# Patient Record
Sex: Female | Born: 1975 | Race: Asian | Hispanic: No | Marital: Married | Smoking: Never smoker
Health system: Southern US, Community
[De-identification: ages and names within clinical notes are randomized; demographics above are authoritative.]

## PROBLEM LIST (undated history)

## (undated) ENCOUNTER — Inpatient Hospital Stay (HOSPITAL_COMMUNITY): Payer: Self-pay

## (undated) DIAGNOSIS — R6252 Short stature (child): Secondary | ICD-10-CM

## (undated) DIAGNOSIS — IMO0002 Reserved for concepts with insufficient information to code with codable children: Secondary | ICD-10-CM

## (undated) DIAGNOSIS — O09529 Supervision of elderly multigravida, unspecified trimester: Secondary | ICD-10-CM

## (undated) DIAGNOSIS — M25531 Pain in right wrist: Secondary | ICD-10-CM

## (undated) DIAGNOSIS — D649 Anemia, unspecified: Secondary | ICD-10-CM

## (undated) DIAGNOSIS — R2230 Localized swelling, mass and lump, unspecified upper limb: Secondary | ICD-10-CM

## (undated) DIAGNOSIS — R300 Dysuria: Secondary | ICD-10-CM

## (undated) HISTORY — DX: Pain in right wrist: M25.531

## (undated) HISTORY — DX: Reserved for concepts with insufficient information to code with codable children: IMO0002

## (undated) HISTORY — DX: Short stature (child): R62.52

## (undated) HISTORY — DX: Supervision of elderly multigravida, unspecified trimester: O09.529

## (undated) HISTORY — DX: Localized swelling, mass and lump, unspecified upper limb: R22.30

## (undated) HISTORY — DX: Anemia, unspecified: D64.9

## (undated) HISTORY — DX: Dysuria: R30.0

---

## 1998-03-27 HISTORY — PX: THERAPEUTIC ABORTION: SHX798

## 1998-03-27 HISTORY — PX: WISDOM TOOTH EXTRACTION: SHX21

## 2005-03-30 ENCOUNTER — Other Ambulatory Visit: Admission: RE | Admit: 2005-03-30 | Discharge: 2005-03-30 | Payer: Self-pay | Admitting: Obstetrics and Gynecology

## 2005-07-16 ENCOUNTER — Emergency Department (HOSPITAL_COMMUNITY): Admission: EM | Admit: 2005-07-16 | Discharge: 2005-07-16 | Payer: Self-pay | Admitting: Emergency Medicine

## 2006-06-13 ENCOUNTER — Other Ambulatory Visit: Admission: RE | Admit: 2006-06-13 | Discharge: 2006-06-13 | Payer: Self-pay | Admitting: Obstetrics & Gynecology

## 2006-09-28 IMAGING — CR DG CHEST 2V
2 series · 2 of 2 positions shown · non-contrast
Comparison: none

CLINICAL DATA: Left-sided chest pain.
 CHEST - 2 VIEW:

[w chest pa]
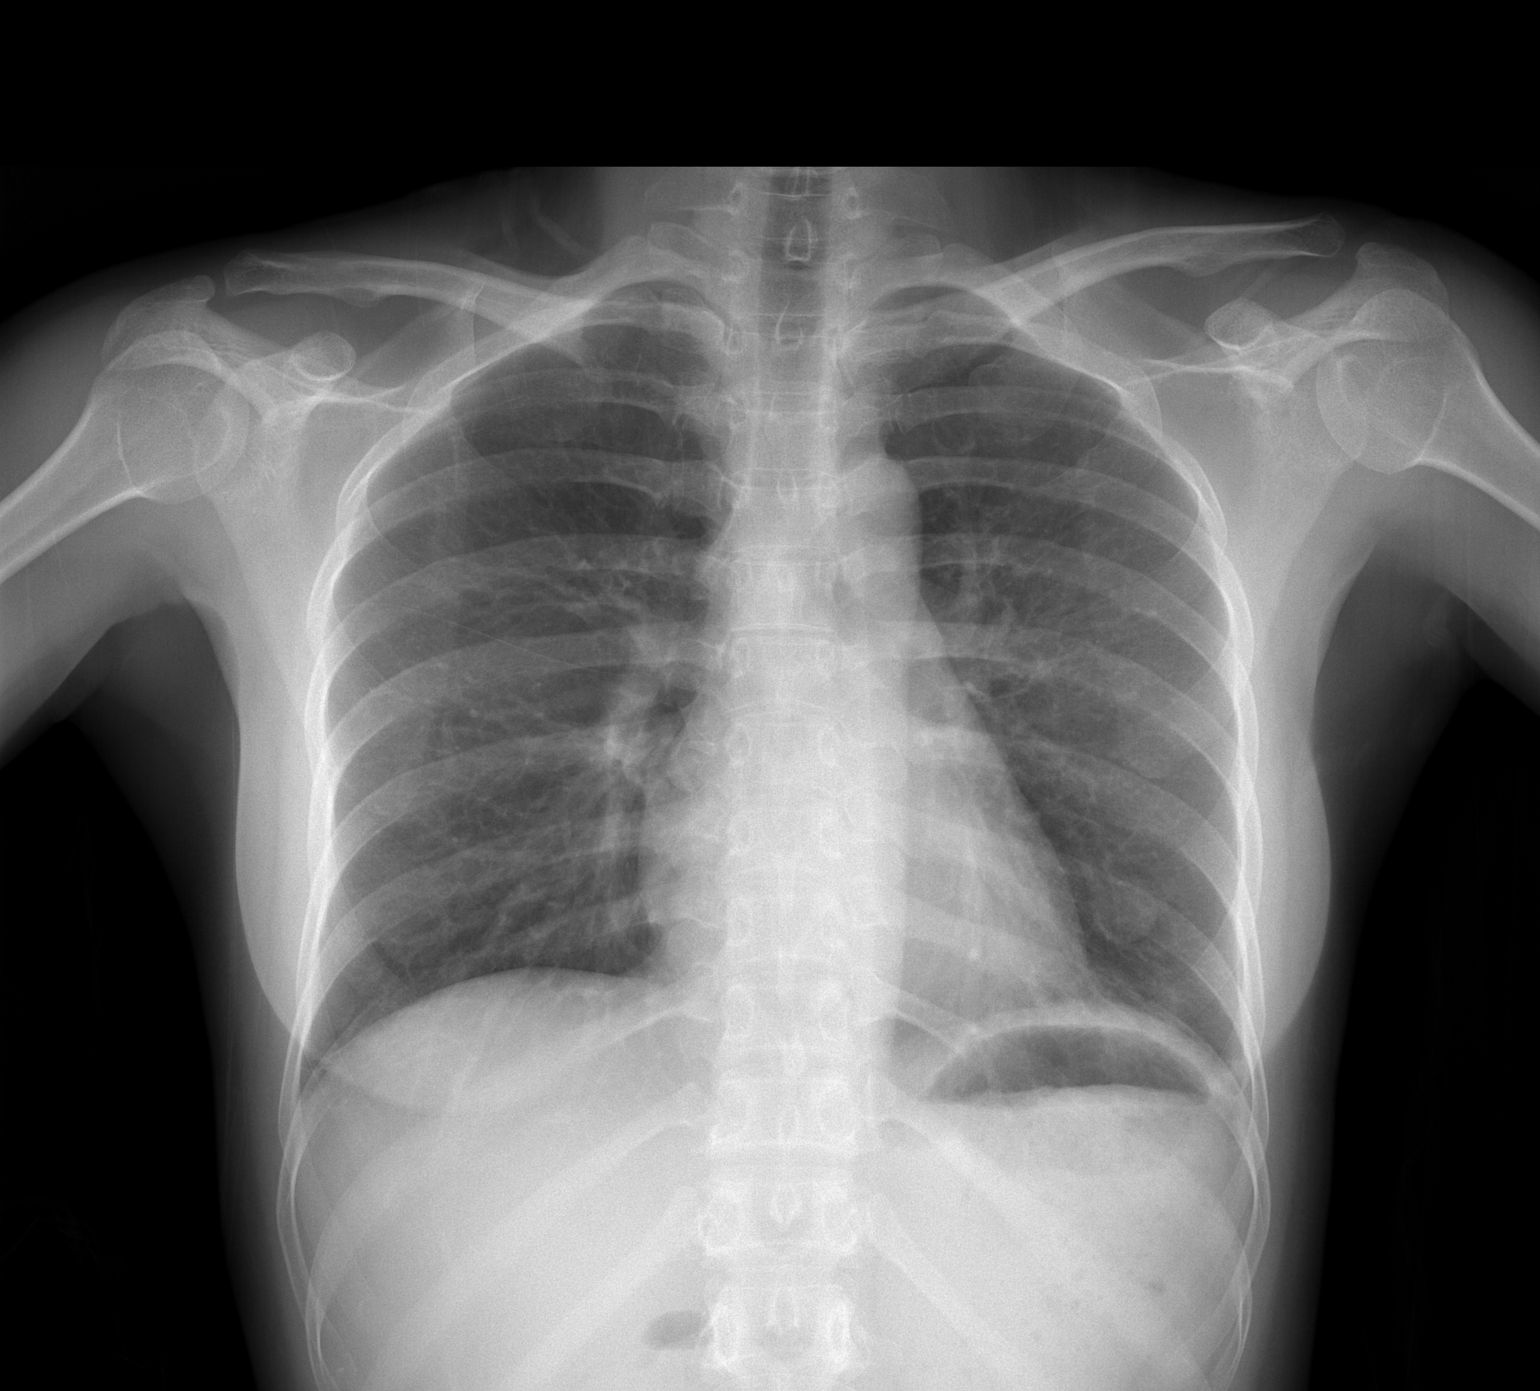

[w chest lat]
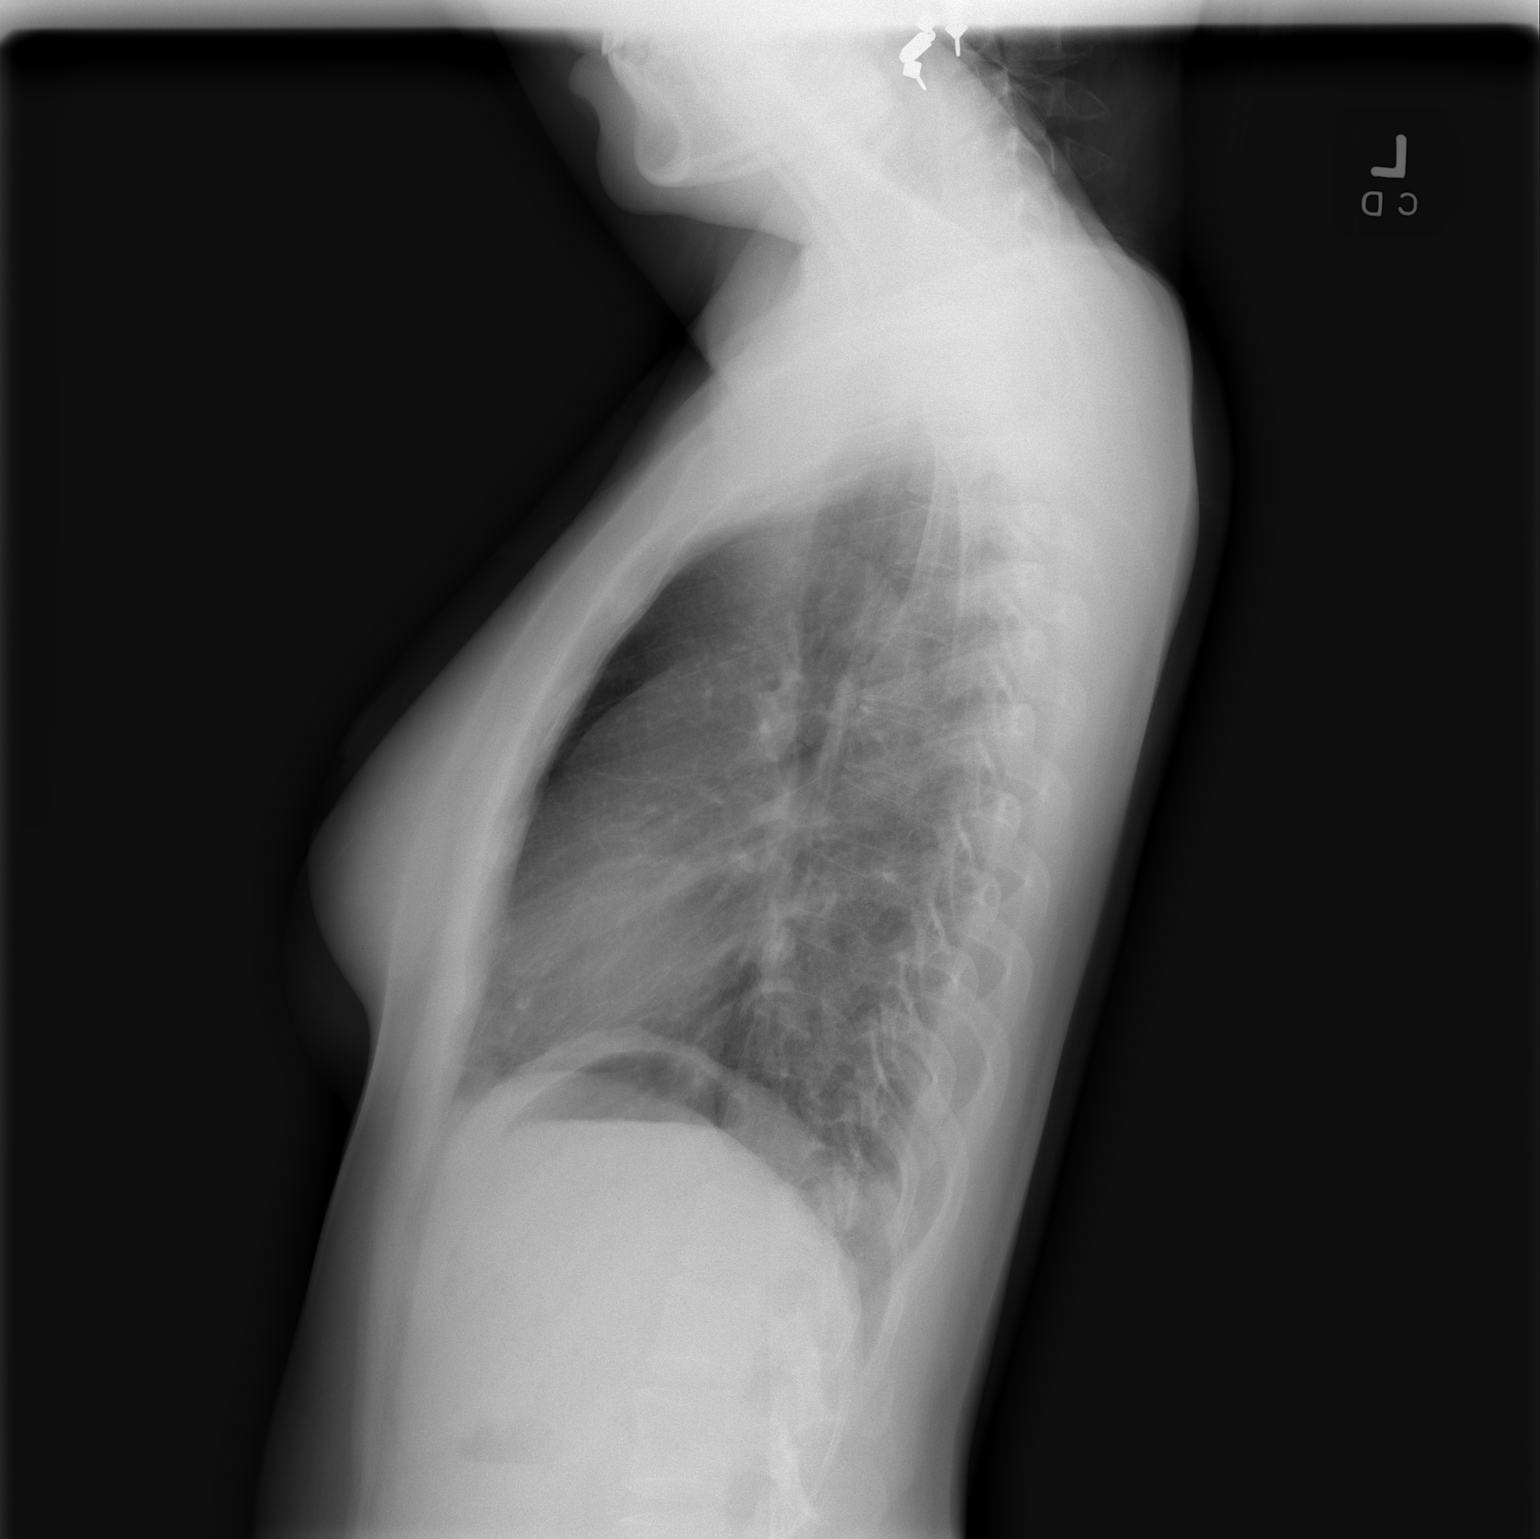

[2 of 2 positions shown; findings below may reference images not displayed]

FINDINGS: The heart size and mediastinal contours are within normal limits.  Both lungs are clear.  The visualized skeletal structures are unremarkable.
 Bilateral nodular opacities overlying the lower lobes demonstrate peripheral lucency consistent with nipple shadows.
IMPRESSION: No active cardiopulmonary disease.

## 2007-09-23 ENCOUNTER — Other Ambulatory Visit: Admission: RE | Admit: 2007-09-23 | Discharge: 2007-09-23 | Payer: Self-pay | Admitting: Obstetrics & Gynecology

## 2009-11-11 ENCOUNTER — Inpatient Hospital Stay (HOSPITAL_COMMUNITY)
Admission: AD | Admit: 2009-11-11 | Discharge: 2009-11-14 | Payer: Self-pay | Source: Home / Self Care | Admitting: Obstetrics and Gynecology

## 2009-12-27 DIAGNOSIS — R300 Dysuria: Secondary | ICD-10-CM

## 2009-12-27 HISTORY — DX: Dysuria: R30.0

## 2010-05-02 DIAGNOSIS — M25531 Pain in right wrist: Secondary | ICD-10-CM

## 2010-05-02 DIAGNOSIS — R2230 Localized swelling, mass and lump, unspecified upper limb: Secondary | ICD-10-CM

## 2010-05-02 HISTORY — DX: Localized swelling, mass and lump, unspecified upper limb: R22.30

## 2010-05-02 HISTORY — DX: Pain in right wrist: M25.531

## 2010-05-03 ENCOUNTER — Other Ambulatory Visit: Payer: Self-pay | Admitting: Obstetrics and Gynecology

## 2010-05-03 DIAGNOSIS — N6320 Unspecified lump in the left breast, unspecified quadrant: Secondary | ICD-10-CM

## 2010-05-04 ENCOUNTER — Other Ambulatory Visit: Payer: Self-pay | Admitting: Obstetrics and Gynecology

## 2010-05-04 DIAGNOSIS — N6320 Unspecified lump in the left breast, unspecified quadrant: Secondary | ICD-10-CM

## 2010-05-09 ENCOUNTER — Ambulatory Visit
Admission: RE | Admit: 2010-05-09 | Discharge: 2010-05-09 | Disposition: A | Discharge status: Home / Self Care | Payer: BC Managed Care – PPO | Source: Ambulatory Visit | Attending: Obstetrics and Gynecology | Admitting: Obstetrics and Gynecology

## 2010-05-09 DIAGNOSIS — N6320 Unspecified lump in the left breast, unspecified quadrant: Secondary | ICD-10-CM

## 2010-06-10 LAB — CBC
HCT: 37.7 % (ref 36.0–46.0)
Hemoglobin: 12 g/dL (ref 12.0–15.0)
Hemoglobin: 9.3 g/dL — ABNORMAL LOW (ref 12.0–15.0)
MCH: 26.2 pg (ref 26.0–34.0)
MCH: 26.9 pg (ref 26.0–34.0)
MCHC: 32.7 g/dL (ref 30.0–36.0)
MCV: 82.3 fL (ref 78.0–100.0)
Platelets: 230 10*3/uL (ref 150–400)
RBC: 3.47 MIL/uL — ABNORMAL LOW (ref 3.87–5.11)
RBC: 4.59 MIL/uL (ref 3.87–5.11)
RDW: 15 % (ref 11.5–15.5)

## 2010-06-10 LAB — RPR: RPR Ser Ql: NONREACTIVE

## 2011-03-28 HISTORY — PX: WRIST SURGERY: SHX841

## 2011-04-28 DIAGNOSIS — R87619 Unspecified abnormal cytological findings in specimens from cervix uteri: Secondary | ICD-10-CM

## 2011-04-28 DIAGNOSIS — IMO0002 Reserved for concepts with insufficient information to code with codable children: Secondary | ICD-10-CM

## 2011-04-28 HISTORY — DX: Unspecified abnormal cytological findings in specimens from cervix uteri: R87.619

## 2011-04-28 HISTORY — DX: Reserved for concepts with insufficient information to code with codable children: IMO0002

## 2011-05-30 ENCOUNTER — Encounter (INDEPENDENT_AMBULATORY_CARE_PROVIDER_SITE_OTHER): Payer: BC Managed Care – PPO | Admitting: Obstetrics and Gynecology

## 2011-05-30 DIAGNOSIS — N970 Female infertility associated with anovulation: Secondary | ICD-10-CM

## 2011-05-30 DIAGNOSIS — R87612 Low grade squamous intraepithelial lesion on cytologic smear of cervix (LGSIL): Secondary | ICD-10-CM

## 2011-09-18 ENCOUNTER — Encounter: Payer: BC Managed Care – PPO | Admitting: Obstetrics and Gynecology

## 2011-09-26 ENCOUNTER — Encounter: Payer: Self-pay | Admitting: Obstetrics and Gynecology

## 2011-09-26 ENCOUNTER — Ambulatory Visit (INDEPENDENT_AMBULATORY_CARE_PROVIDER_SITE_OTHER): Payer: BC Managed Care – PPO | Admitting: Obstetrics and Gynecology

## 2011-09-26 VITALS — BP 98/52 | Ht 60.0 in | Wt 108.0 lb

## 2011-09-26 DIAGNOSIS — R87612 Low grade squamous intraepithelial lesion on cytologic smear of cervix (LGSIL): Secondary | ICD-10-CM

## 2011-09-26 DIAGNOSIS — IMO0002 Reserved for concepts with insufficient information to code with codable children: Secondary | ICD-10-CM

## 2011-09-26 NOTE — Progress Notes (Signed)
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  Nursing only once nightly.  Has had 6 mos of regular menses  Pelvic exam: normal external genitalia, vulva, vagina, cervix.   ASSESSMENT: LGSIL Desires consception  RECOMMENDATION: Continue Pregnitude. Pap today.  Repeat in 6 mos with aex

## 2011-09-27 LAB — PAP IG W/ RFLX HPV ASCU

## 2011-11-06 ENCOUNTER — Telehealth: Payer: Self-pay | Admitting: Obstetrics and Gynecology

## 2011-11-06 ENCOUNTER — Encounter: Payer: Self-pay | Admitting: Obstetrics and Gynecology

## 2011-11-06 ENCOUNTER — Ambulatory Visit (INDEPENDENT_AMBULATORY_CARE_PROVIDER_SITE_OTHER): Payer: BC Managed Care – PPO | Admitting: Obstetrics and Gynecology

## 2011-11-06 VITALS — BP 88/56 | Resp 16 | Ht 60.0 in | Wt 108.0 lb

## 2011-11-06 DIAGNOSIS — N898 Other specified noninflammatory disorders of vagina: Secondary | ICD-10-CM

## 2011-11-06 DIAGNOSIS — Z3201 Encounter for pregnancy test, result positive: Secondary | ICD-10-CM

## 2011-11-06 LAB — POCT WET PREP (WET MOUNT)
Clue Cells Wet Prep Whiff POC: NEGATIVE
Trichomonas Wet Prep HPF POC: NEGATIVE
pH: 4.5

## 2011-11-06 LAB — POCT URINE PREGNANCY: Preg Test, Ur: POSITIVE

## 2011-11-06 NOTE — Progress Notes (Signed)
C/o: d/c for a few days. Postive home preg. Test on Wednesday. Pt request urine preg. Test.

## 2011-11-12 NOTE — Progress Notes (Signed)
Pt is a G2P1  UPT +   LMP 6/26, regular normal cycles =[redacted]w[redacted]d - EDD 07/02/12    Pt reports having sm amt white d/c on Saturday - none now Denies any VB, no cramping or pain, requests exam    Wet prep - neg   Reassured pt  rv'd ABC's pregnancy  To schedule NOB interview and w/u in about 3wks

## 2011-11-14 ENCOUNTER — Ambulatory Visit (INDEPENDENT_AMBULATORY_CARE_PROVIDER_SITE_OTHER): Payer: BC Managed Care – PPO | Admitting: Obstetrics and Gynecology

## 2011-11-14 DIAGNOSIS — Z331 Pregnant state, incidental: Secondary | ICD-10-CM

## 2011-11-15 LAB — PRENATAL PANEL VII
Antibody Screen: NEGATIVE
Basophils Absolute: 0 10*3/uL (ref 0.0–0.1)
Eosinophils Absolute: 0.1 10*3/uL (ref 0.0–0.7)
Eosinophils Relative: 1 % (ref 0–5)
MCH: 25.4 pg — ABNORMAL LOW (ref 26.0–34.0)
MCHC: 32 g/dL (ref 30.0–36.0)
Monocytes Absolute: 0.3 10*3/uL (ref 0.1–1.0)
Rh Type: POSITIVE
Rubella: 173.7 IU/mL — ABNORMAL HIGH
WBC: 6.8 10*3/uL (ref 4.0–10.5)

## 2011-11-15 LAB — POCT URINALYSIS DIPSTICK: Spec Grav, UA: 1.015

## 2011-11-16 LAB — HEMOGLOBINOPATHY EVALUATION
Hgb A: 97.2 % (ref 96.8–97.8)
Hgb F Quant: 0.6 % (ref 0.0–2.0)
Hgb S Quant: 0 %

## 2011-11-16 LAB — CULTURE, OB URINE
Colony Count: NO GROWTH
Organism ID, Bacteria: NO GROWTH

## 2011-11-20 ENCOUNTER — Other Ambulatory Visit: Payer: Self-pay | Admitting: Obstetrics and Gynecology

## 2011-12-06 ENCOUNTER — Ambulatory Visit (INDEPENDENT_AMBULATORY_CARE_PROVIDER_SITE_OTHER): Payer: BC Managed Care – PPO | Admitting: Obstetrics and Gynecology

## 2011-12-06 ENCOUNTER — Telehealth: Payer: Self-pay | Admitting: Obstetrics and Gynecology

## 2011-12-06 ENCOUNTER — Encounter: Payer: BC Managed Care – PPO | Admitting: Obstetrics and Gynecology

## 2011-12-06 ENCOUNTER — Other Ambulatory Visit: Payer: Self-pay | Admitting: Obstetrics and Gynecology

## 2011-12-06 ENCOUNTER — Ambulatory Visit (INDEPENDENT_AMBULATORY_CARE_PROVIDER_SITE_OTHER): Payer: BC Managed Care – PPO

## 2011-12-06 ENCOUNTER — Encounter: Payer: Self-pay | Admitting: Obstetrics and Gynecology

## 2011-12-06 VITALS — BP 102/62 | Wt 108.0 lb

## 2011-12-06 DIAGNOSIS — O368390 Maternal care for abnormalities of the fetal heart rate or rhythm, unspecified trimester, not applicable or unspecified: Secondary | ICD-10-CM

## 2011-12-06 DIAGNOSIS — O021 Missed abortion: Secondary | ICD-10-CM | POA: Insufficient documentation

## 2011-12-06 DIAGNOSIS — O09529 Supervision of elderly multigravida, unspecified trimester: Secondary | ICD-10-CM

## 2011-12-06 NOTE — Progress Notes (Signed)
[redacted]w[redacted]d Pt had pap and wet prep 11/06/2011

## 2011-12-06 NOTE — Progress Notes (Signed)
Subjective:    Lisa Russo is being seen today for her first obstetrical visit at [redacted]w[redacted]d gestation by LMP.   No hx of bleeding or pain.  Reports had massage in July in which the masseuse "massaged her lower abdomen".  No pain then.  She was worried that would have caused an issue.  I advised her that was not likely a cause of any issue with the pregnancy, due to the early gestation.  Unable to hear FHT on exam. [place] today:  [redacted]w[redacted]d IUFD, with calcified yolk sac, Left ovarian simple cyst.  Findings reviewed with patient and options discussed. I will call patient tomorrow after 2 pm to discuss her decision. She is leaning toward D&E. B+   Her obstetrical history is significant for: Patient Active Problem List  Diagnosis  . LGSIL (low grade squamous intraepithelial dysplasia)  . AMA (advanced maternal age) multigravida 35+   PE: Chest clear Heart RRR without mumur Abdomen--soft, NT Pelvic--unable to palpate uterine size, much digestive noise with doppler. Ext WNL  Assessment: Missed AB  Plan: Support to patient for loss. Reviewed options for treatment of loss, including observation, D&E, cytotech. Patient will consider and inform me of decision when I speak with her tomorrow. She will call before then with any issues or concerns.  Nigel Bridgeman CNM, [place] 12/06/2011 2:21 PM

## 2011-12-06 NOTE — Telephone Encounter (Signed)
Told office would be notified to call her, call [facility] in not called prior to 10am Lavera Guise, CNM

## 2011-12-07 ENCOUNTER — Ambulatory Visit (INDEPENDENT_AMBULATORY_CARE_PROVIDER_SITE_OTHER): Payer: BC Managed Care – PPO | Admitting: Obstetrics and Gynecology

## 2011-12-07 ENCOUNTER — Ambulatory Visit (INDEPENDENT_AMBULATORY_CARE_PROVIDER_SITE_OTHER): Payer: BC Managed Care – PPO

## 2011-12-07 ENCOUNTER — Other Ambulatory Visit: Payer: Self-pay | Admitting: Obstetrics and Gynecology

## 2011-12-07 ENCOUNTER — Telehealth: Payer: Self-pay

## 2011-12-07 ENCOUNTER — Encounter (HOSPITAL_COMMUNITY): Payer: Self-pay | Admitting: *Deleted

## 2011-12-07 ENCOUNTER — Encounter: Payer: Self-pay | Admitting: Obstetrics and Gynecology

## 2011-12-07 VITALS — BP 102/62 | Wt 108.0 lb

## 2011-12-07 DIAGNOSIS — O3680X0 Pregnancy with inconclusive fetal viability, not applicable or unspecified: Secondary | ICD-10-CM

## 2011-12-07 DIAGNOSIS — O26849 Uterine size-date discrepancy, unspecified trimester: Secondary | ICD-10-CM

## 2011-12-07 DIAGNOSIS — O021 Missed abortion: Secondary | ICD-10-CM

## 2011-12-07 LAB — US OB TRANSVAGINAL

## 2011-12-07 NOTE — Progress Notes (Signed)
Here to f/u on missed AB dx at [facility] visit yesterday. Was 11 weeks by dates, 7+2 by [place] with no FHR. Discussed options for management with patient. She called to request repeat US before deciding on management. Husband with her today.  [place]:  [redacted]w[redacted]d SIUP with no FHT, calcified YS.  Retroverted uterus.  Reviewed findings with patient and husband--support offered and questions reviewed. They do wish to proceed with D&E tomorrow, with genetic testing of POC if possible.  R&B of D&E reviewed, including bleeding, infection, damage to other organs.  Patient and husband seem to understand and wish to proceed with scheduling D&E. Office will schedule and f/u with patient today regarding plan for surgery tomorrow with SR. Patient asked that I inform [facility] of her situation.  Will send message via EPIC.

## 2011-12-07 NOTE — H&P (Signed)
Lisa Russo is a 36 yo G3P1021 at 11 weeks by dates, but 7 2/7 weeks with a missed AB by [place] on 12/06/11, verified by repeat US on 12/07/11. She had presented for NOB visit and FHT could not be found. [place] documented the loss, and patient was sent home to consider options.  She requested repeat US for verification of findings. This was done on 9/12 with confirmation of 7 week IUFD. She and her husband met with Nigel Bridgeman, CNM, on 12/07/11 and wished to proceed with D&E on 12/08/11.  This was a desired pregnancy, and patient and her husband request genetic testing on POC from D&E. B+ blood type documented in previous pregnancy records.  Patient Active Problem List  Diagnosis  . LGSIL (low grade squamous intraepithelial dysplasia)  . AMA (advanced maternal age) multigravida 35+  . Missed ab 12/06/11   Past Medical History  Diagnosis Date  . Dysuria 12/27/09  . Mass of axilla 05/02/10  . Wrist pain, right 05/02/10  . AMA (advanced maternal age) multigravida 35+   . Short stature   . Abnormal Pap smear 04/2011    LGSIL;Colpo done and rpt in 6 months  . Mild anemia   . SVD (spontaneous vaginal delivery) 2011    x 1   OB History    Grav Para Term Preterm Abortions TAB SAB Ect Mult Living   3 1 1  2  0 1   1     Social Hx: Patient from [place], married with supportive spouse, Arora Coakley, who is American.  She is employed as an Equities trader.  PE: Chest clear Heart RRR without murmur Abd soft, NT Uterus--small, NT Pelvic--cervix closed, NT Ext WNL  B+ type from previous records.  Assessment: Missed AB at 7 weeks--patient desires D&E. B+ type  Plan: Admit to [facility] on 12/08[facility]11 per consult with Dr. Estanislado Pandy for outpatient D&E. Routine pre-op orders Support to patient and husband for loss. Will inform OR staff and Dr. Estanislado Pandy of patient's desire for genetic testing of POC. Follow-up as directed by Dr. Estanislado Pandy.   Nigel Bridgeman, CNM, [place] 12/07/11 7:45pm

## 2011-12-07 NOTE — Telephone Encounter (Signed)
Spoke with pt rgd msg pt wants repeat u/s before procedure tomorrow pt has appt for u/s at 11:00 and f/u with vl ok per vl pt voice understadning

## 2011-12-07 NOTE — Telephone Encounter (Signed)
Lm on vm tcb rgd sch u/s

## 2011-12-07 NOTE — Telephone Encounter (Signed)
Message copied by Rolla Plate on Thu Dec 07, 2011  8:35 AM ------      Message from: Jaymes Graff      Created: Wed Dec 06, 2011 10:58 PM       Patient would like a repeat US before her D&E on Friday.  Please schedule for her.  Thank you

## 2011-12-08 ENCOUNTER — Encounter (HOSPITAL_COMMUNITY): Payer: Self-pay | Admitting: Anesthesiology

## 2011-12-08 ENCOUNTER — Ambulatory Visit (HOSPITAL_COMMUNITY)
Admission: AD | Admit: 2011-12-08 | Discharge: 2011-12-08 | Disposition: A | Discharge status: Home / Self Care | Payer: BC Managed Care – PPO | Source: Ambulatory Visit | Attending: Obstetrics and Gynecology | Admitting: Obstetrics and Gynecology

## 2011-12-08 ENCOUNTER — Encounter (HOSPITAL_COMMUNITY)
Admission: AD | Disposition: A | Discharge status: Home / Self Care | Payer: Self-pay | Source: Ambulatory Visit | Attending: Obstetrics and Gynecology

## 2011-12-08 ENCOUNTER — Encounter (HOSPITAL_COMMUNITY): Payer: Self-pay

## 2011-12-08 ENCOUNTER — Encounter (HOSPITAL_COMMUNITY): Payer: Self-pay | Admitting: Pharmacy Technician

## 2011-12-08 ENCOUNTER — Ambulatory Visit (HOSPITAL_COMMUNITY): Payer: BC Managed Care – PPO | Admitting: Anesthesiology

## 2011-12-08 DIAGNOSIS — O021 Missed abortion: Secondary | ICD-10-CM | POA: Insufficient documentation

## 2011-12-08 HISTORY — PX: DILATION AND EVACUATION: SHX1459

## 2011-12-08 LAB — CBC
HCT: 35.4 % — ABNORMAL LOW (ref 36.0–46.0)
MCH: 26.1 pg (ref 26.0–34.0)
MCV: 80.5 fL (ref 78.0–100.0)
RBC: 4.4 MIL/uL (ref 3.87–5.11)
RDW: 13.1 % (ref 11.5–15.5)
WBC: 5.1 10*3/uL (ref 4.0–10.5)

## 2011-12-08 SURGERY — DILATION AND EVACUATION, UTERUS
Anesthesia: Monitor Anesthesia Care | Site: Vagina | Wound class: Clean Contaminated

## 2011-12-08 MED ORDER — MIDAZOLAM HCL 2 MG/2ML IJ SOLN
INTRAMUSCULAR | Status: AC
  Filled 2011-12-08: qty 2

## 2011-12-08 MED ORDER — KETOROLAC TROMETHAMINE 30 MG/ML IJ SOLN
INTRAMUSCULAR | Status: DC | PRN
  Administered 2011-12-08: 30 mg via INTRAVENOUS

## 2011-12-08 MED ORDER — FENTANYL CITRATE 0.05 MG/ML IJ SOLN
INTRAMUSCULAR | Status: AC
  Filled 2011-12-08: qty 2

## 2011-12-08 MED ORDER — MIDAZOLAM HCL 5 MG/5ML IJ SOLN
INTRAMUSCULAR | Status: DC | PRN
  Administered 2011-12-08: 2 mg via INTRAVENOUS

## 2011-12-08 MED ORDER — SILVER NITRATE-POT NITRATE 75-25 % EX MISC
CUTANEOUS | Status: AC
  Filled 2011-12-08: qty 1

## 2011-12-08 MED ORDER — LIDOCAINE HCL (CARDIAC) 20 MG/ML IV SOLN
INTRAVENOUS | Status: DC | PRN
  Administered 2011-12-08: 50 mg via INTRAVENOUS

## 2011-12-08 MED ORDER — CHLOROPROCAINE HCL 1 % IJ SOLN
INTRAMUSCULAR | Status: AC
  Filled 2011-12-08: qty 30

## 2011-12-08 MED ORDER — KETOROLAC TROMETHAMINE 30 MG/ML IJ SOLN
INTRAMUSCULAR | Status: AC
  Filled 2011-12-08: qty 1

## 2011-12-08 MED ORDER — DEXAMETHASONE SODIUM PHOSPHATE 4 MG/ML IJ SOLN
INTRAMUSCULAR | Status: DC | PRN
  Administered 2011-12-08: 10 mg via INTRAVENOUS

## 2011-12-08 MED ORDER — IBUPROFEN 600 MG PO TABS: 600.0000 mg | ORAL_TABLET | Freq: Four times a day (QID) | ORAL | Status: AC | PRN

## 2011-12-08 MED ORDER — ONDANSETRON HCL 4 MG/2ML IJ SOLN
INTRAMUSCULAR | Status: DC | PRN
  Administered 2011-12-08: 4 mg via INTRAVENOUS

## 2011-12-08 MED ORDER — CHLOROPROCAINE HCL 1 % IJ SOLN
INTRAMUSCULAR | Status: DC | PRN
  Administered 2011-12-08: 20 mL

## 2011-12-08 MED ORDER — FENTANYL CITRATE 0.05 MG/ML IJ SOLN
INTRAMUSCULAR | Status: DC | PRN
  Administered 2011-12-08 (×2): 50 ug via INTRAVENOUS

## 2011-12-08 MED ORDER — ONDANSETRON HCL 4 MG/2ML IJ SOLN
INTRAMUSCULAR | Status: AC
  Filled 2011-12-08: qty 2

## 2011-12-08 MED ORDER — LIDOCAINE HCL (CARDIAC) 20 MG/ML IV SOLN
INTRAVENOUS | Status: AC
  Filled 2011-12-08: qty 5

## 2011-12-08 MED ORDER — LACTATED RINGERS IV SOLN
INTRAVENOUS | Status: DC
  Administered 2011-12-08: 50 mL/h via INTRAVENOUS
  Administered 2011-12-08: 10:00:00 via INTRAVENOUS
  Administered 2011-12-08: 50 mL/h via INTRAVENOUS

## 2011-12-08 MED ORDER — PROPOFOL 10 MG/ML IV EMUL
INTRAVENOUS | Status: AC
  Filled 2011-12-08: qty 20

## 2011-12-08 MED ORDER — PROPOFOL 10 MG/ML IV EMUL
INTRAVENOUS | Status: DC | PRN
  Administered 2011-12-08: 10 mg via INTRAVENOUS
  Administered 2011-12-08 (×2): 20 mg via INTRAVENOUS
  Administered 2011-12-08: 50 mg via INTRAVENOUS

## 2011-12-08 SURGICAL SUPPLY — 22 items
CATH ROBINSON RED A/P 16FR (CATHETERS) ×2 IMPLANT
CLOTH BEACON ORANGE TIMEOUT ST (SAFETY) ×2 IMPLANT
DECANTER SPIKE VIAL GLASS SM (MISCELLANEOUS) ×2 IMPLANT
DRESSING TELFA 8X3 (GAUZE/BANDAGES/DRESSINGS) ×2 IMPLANT
GLOVE BIOGEL PI IND STRL 7.0 (GLOVE) ×2 IMPLANT
GLOVE BIOGEL PI INDICATOR 7.0 (GLOVE) ×2
GOWN PREVENTION PLUS LG XLONG (DISPOSABLE) ×2 IMPLANT
KIT BERKELEY 1ST TRIMESTER 3/8 (MISCELLANEOUS) ×2 IMPLANT
NEEDLE SPNL 22GX3.5 QUINCKE BK (NEEDLE) ×2 IMPLANT
NS IRRIG 1000ML POUR BTL (IV SOLUTION) ×2 IMPLANT
PACK VAGINAL MINOR WOMEN LF (CUSTOM PROCEDURE TRAY) ×2 IMPLANT
PAD OB MATERNITY 4.3X12.25 (PERSONAL CARE ITEMS) ×2 IMPLANT
PAD PREP 24X48 CUFFED NSTRL (MISCELLANEOUS) ×2 IMPLANT
SET BERKELEY SUCTION TUBING (SUCTIONS) ×2 IMPLANT
SUT VIC AB 3-0 SH 27 (SUTURE) ×2
SUT VIC AB 3-0 SH 27XBRD (SUTURE) IMPLANT
SYR CONTROL 10ML LL (SYRINGE) ×2 IMPLANT
TOWEL OR 17X24 6PK STRL BLUE (TOWEL DISPOSABLE) ×2 IMPLANT
VACURETTE 10 RIGID CVD (CANNULA) IMPLANT
VACURETTE 7MM CVD STRL WRAP (CANNULA) IMPLANT
VACURETTE 8 RIGID CVD (CANNULA) IMPLANT
VACURETTE 9 RIGID CVD (CANNULA) IMPLANT

## 2011-12-08 NOTE — Op Note (Signed)
Preoperative diagnosis: Missed AB  Postoperative diagnosis: Same  Anesthesia: IV sedation and paracervical block  Anesthesiologist: Dr. Malen Gauze  Procedure: Dilatation and evacuation  Surgeon: Dr. Dois Davenport Alontae Chaloux  Estimated blood loss: Minimal  Procedure:  After being informed of the planned procedure with possible complications including bleeding, infection and injury to uterus, informed consent is obtained and patient is taken to or #8. She is placed in the lithotomy position and given IV sedation without complication. She is then prepped and draped in a sterile  fashion and her bladder is emptied with an in and out red rubber catheter.  Pelvic exam reveals a anteverted  uterus compatible with [redacted] weeks gestation. Both adnexa are felt and normal.  A weighted speculum is inserted in the vagina and the anterior lip of the cervix was grasped with a tenaculum forcep. We proceed with a paracervical block using 20 cc of Nesacaine 1%. The uterus was then sounded at 12 cm. The cervix was easily dilated using Hegar dilator until  #31. Using a #9 curved cannula, we proceed with evacuation of products of conception without difficulty. We then proceed with sharp curettage of the uterine cavity to confirm complete evacuation.  Instruments are then removed. Instrument and sponge count is complete x2.   Estimated blood loss is minimal.  The procedure is very well tolerated by the patient is taken to recovery room in a well and stable condition.  Specimen: Products of conception sent to pathology

## 2011-12-08 NOTE — Anesthesia Postprocedure Evaluation (Addendum)
  Anesthesia Post-op Note  Patient: Lisa Russo  Procedure(s) Performed: Procedure(s) (LRB) with comments: DILATATION AND EVACUATION (N/A)  Patient Location: PACU  Anesthesia Type: MAC  Level of Consciousness: awake, alert  and oriented  Airway and Oxygen Therapy: Patient Spontanous Breathing  Post-op Pain: none  Post-op Assessment: Post-op Vital signs reviewed, Patient's Cardiovascular Status Stable, Respiratory Function Stable, Patent Airway, No signs of Nausea or vomiting and Pain level controlled  Post-op Vital Signs: Reviewed and stable  Complications: No apparent anesthesia complications

## 2011-12-08 NOTE — Interval H&P Note (Signed)
History and Physical Interval Note:  12/08/2011 11:52 AM  Lisa Russo  has presented today for surgery, with the diagnosis of Missed Ab  The various methods of treatment have been discussed with the patient and family. After consideration of risks, benefits and other options for treatment, the patient has consented to  Procedure(s) (LRB) with comments: DILATATION AND EVACUATION (N/A) as a surgical intervention .  The patient's history has been reviewed, patient examined, no change in status, stable for surgery.  I have reviewed the patient's chart and labs.  Questions were answered to the patient's satisfaction.     [name]

## 2011-12-08 NOTE — Transfer of Care (Signed)
Immediate Anesthesia Transfer of Care Note  Patient: Lisa Russo  Procedure(s) Performed: Procedure(s) (LRB) with comments: DILATATION AND EVACUATION (N/A)  Patient Location: PACU  Anesthesia Type: MAC  Level of Consciousness: awake  Airway & Oxygen Therapy: Patient Spontanous Breathing and Patient connected to nasal cannula oxygen  Post-op Assessment: Report given to PACU RN and Post -op Vital signs reviewed and stable  Post vital signs: stable  Complications: No apparent anesthesia complications

## 2011-12-08 NOTE — H&P (View-Only) (Signed)
Lisa Russo is a 36 yo G3P1021 at 11 weeks by dates, but 7 2/7 weeks with a missed AB by US on 12/06/11, verified by repeat US on 12/07/11. She had presented for NOB visit and FHT could not be found. US documented the loss, and patient was sent home to consider options.  She requested repeat US for verification of findings. This was done on 9/12 with confirmation of 7 week IUFD. She and her husband met with Caedan Sumler, CNM, on 12/07/11 and wished to proceed with D&E on 12/08/11.  This was a desired pregnancy, and patient and her husband request genetic testing on POC from D&E. B+ blood type documented in previous pregnancy records.  Patient Active Problem List  Diagnosis  . LGSIL (low grade squamous intraepithelial dysplasia)  . AMA (advanced maternal age) multigravida 35+  . Missed ab 12/06/11   Past Medical History  Diagnosis Date  . Dysuria 12/27/09  . Mass of axilla 05/02/10  . Wrist pain, right 05/02/10  . AMA (advanced maternal age) multigravida 35+   . Short stature   . Abnormal Pap smear 04/2011    LGSIL;Colpo done and rpt in 6 months  . Mild anemia   . SVD (spontaneous vaginal delivery) 2011    x 1   OB History    Grav Para Term Preterm Abortions TAB SAB Ect Mult Living   3 1 1  2 0 1   1     Social Hx: Patient from [place], married with supportive spouse, Lisa Russo, who is American.  She is employed as an interpreter.  PE: Chest clear Heart RRR without murmur Abd soft, NT Uterus--small, NT Pelvic--cervix closed, NT Ext WNL  B+ type from previous records.  Assessment: Missed AB at 7 weeks--patient desires D&E. B+ type  Plan: Admit to [facility] on 12/08/11 per consult with Dr. Rivard for outpatient D&E. Routine pre-op orders Support to patient and husband for loss. Will inform OR staff and Dr. Rivard of patient's desire for genetic testing of POC. Follow-up as directed by Dr. Rivard.   Nicco Reaume, CNM, [place] 12/07/11 7:45pm     

## 2011-12-08 NOTE — Anesthesia Preprocedure Evaluation (Addendum)
Anesthesia Evaluation  Patient identified by MRN, date of birth, ID band Patient awake    Reviewed: Allergy & Precautions, H&P , NPO status , Patient's Chart, lab work & pertinent test results  Airway Mallampati: II TM Distance: >3 FB Neck ROM: Full    Dental No notable dental hx. (+) Teeth Intact   Pulmonary neg pulmonary ROS,  breath sounds clear to auscultation  Pulmonary exam normal       Cardiovascular Rhythm:Regular Rate:Normal     Neuro/Psych    GI/Hepatic negative GI ROS, Neg liver ROS,   Endo/Other  negative endocrine ROS  Renal/GU negative Renal ROS  negative genitourinary   Musculoskeletal negative musculoskeletal ROS (+)   Abdominal   Peds  Hematology negative hematology ROS (+)   Anesthesia Other Findings   Reproductive/Obstetrics (+) Pregnancy Missed Ab                           Anesthesia Physical Anesthesia Plan  ASA: II  Anesthesia Plan: MAC   Post-op Pain Management:    Induction: Intravenous  Airway Management Planned: Natural Airway and Nasal Cannula  Additional Equipment:   Intra-op Plan:   Post-operative Plan:   Informed Consent: I have reviewed the patients History and Physical, chart, labs and discussed the procedure including the risks, benefits and alternatives for the proposed anesthesia with the patient or authorized representative who has indicated his/her understanding and acceptance.   Dental advisory given  Plan Discussed with: Anesthesiologist, CRNA and Surgeon  Anesthesia Plan Comments:         Anesthesia Quick Evaluation

## 2011-12-11 ENCOUNTER — Encounter (HOSPITAL_COMMUNITY): Payer: Self-pay | Admitting: Obstetrics and Gynecology

## 2011-12-14 ENCOUNTER — Telehealth: Payer: Self-pay | Admitting: Obstetrics and Gynecology

## 2011-12-14 NOTE — Telephone Encounter (Signed)
VM from [facility] at [facility] [phone].  States has received POC for gentic testing but needs demographics and insurance information. Info faxed.

## 2011-12-21 ENCOUNTER — Telehealth: Payer: Self-pay | Admitting: Obstetrics and Gynecology

## 2011-12-21 NOTE — Telephone Encounter (Signed)
Grief card mailed to patient for loss of pregnancy.  Signed by [facility].  -Adrianne Pridgen

## 2011-12-22 ENCOUNTER — Ambulatory Visit (INDEPENDENT_AMBULATORY_CARE_PROVIDER_SITE_OTHER): Payer: BC Managed Care – PPO | Admitting: Obstetrics and Gynecology

## 2011-12-22 ENCOUNTER — Encounter: Payer: Self-pay | Admitting: Obstetrics and Gynecology

## 2011-12-22 VITALS — BP 104/64 | HR 68 | Wt 109.0 lb

## 2011-12-22 DIAGNOSIS — O039 Complete or unspecified spontaneous abortion without complication: Secondary | ICD-10-CM

## 2011-12-22 NOTE — Progress Notes (Signed)
Surgery: D and C  Date: 12/08/2011  Eating a regular diet without difficulty. Bowel movements are normal.  The patient is not having any pain.  Bladder function is returned to normal. Vaginal bleeding: spotting Vaginal discharge: color brown  Subjective:     Lisa Russo is a 36 y.o. female who presents for post-op visit.  Pathology report: POC was reviewed with patient.Genetic report pending.  The following portions of the patient's history were reviewed and updated as appropriate: allergies, current medications, past family history, past medical history, past social history, past surgical history and problem list.  Review of Systems Pertinent items are noted in HPI.   Objective:    BP 104/64  Pulse 68  Wt 109 lb (49.442 kg)  LMP 09/20/2011  Breastfeeding? Unknown Weight:  Wt Readings from Last 1 Encounters:  12/22/11 109 lb (49.442 kg)    BMI: There is no height on file to calculate BMI.  General Appearance: Alert, appropriate appearance for age. No acute distress Pelvic Exam:VV normal. Uterus normal  Assessment:    Doing well postoperatively. Operative findings again reviewed.   Plan:   Observe cycle Will call with genetic results Plan progesterone at next pregnancy

## 2012-01-08 ENCOUNTER — Telehealth: Payer: Self-pay | Admitting: Obstetrics and Gynecology

## 2012-01-08 NOTE — Telephone Encounter (Signed)
LM to RC ld

## 2012-01-12 NOTE — Progress Notes (Signed)
Left msg for Lisa Russo at [facility] to see if this was actually done. Lab at [facility] cant see anything as far as a chromosome study being done or sent.  ld

## 2012-01-12 NOTE — Progress Notes (Signed)
I cant find the chromosome study on the POC.  I'll try calling

## 2012-01-16 ENCOUNTER — Telehealth: Payer: Self-pay

## 2012-01-16 NOTE — Telephone Encounter (Signed)
Message copied by Larwance Rote on Tue Jan 16, 2012 11:58 AM ------      Message from: Lisa Russo      Created: Thu Jan 11, 2012  1:37 PM       Vernona Rieger I can't find the result..it says see separate report?

## 2012-03-26 ENCOUNTER — Other Ambulatory Visit (INDEPENDENT_AMBULATORY_CARE_PROVIDER_SITE_OTHER): Payer: BC Managed Care – PPO

## 2012-03-26 ENCOUNTER — Telehealth: Payer: Self-pay | Admitting: Obstetrics and Gynecology

## 2012-03-26 DIAGNOSIS — N912 Amenorrhea, unspecified: Secondary | ICD-10-CM

## 2012-03-26 NOTE — Progress Notes (Unsigned)
Pt here for UPT-positive.  Did Quant today per Chip Boer.   Pt had SAB in Sep.  Gave pt PNV samples.

## 2012-03-27 ENCOUNTER — Telehealth: Payer: Self-pay | Admitting: Obstetrics and Gynecology

## 2012-03-27 NOTE — Telephone Encounter (Signed)
reveiwed QHCG f/o for same on 03/28/2012 Lavera Guise, CNM

## 2012-03-28 ENCOUNTER — Other Ambulatory Visit: Payer: BC Managed Care – PPO

## 2012-03-28 DIAGNOSIS — N912 Amenorrhea, unspecified: Secondary | ICD-10-CM

## 2012-03-29 ENCOUNTER — Telehealth: Payer: Self-pay | Admitting: Obstetrics and Gynecology

## 2012-03-29 NOTE — Telephone Encounter (Signed)
Message copied by Delon Sacramento on Fri Mar 29, 2012  9:10 AM ------      Message from: Cornelius Moras      Created: Fri Mar 29, 2012  5:13 AM      Regarding: Jiles Crocker result       QHCG has dropped slightly--likely SAB, but would do another draw on Monday to confirm.      Offer support, schedule repeat QHCG on Monday.      Thanks.      VL

## 2012-03-29 NOTE — Telephone Encounter (Signed)
Tc to pt regarding msg.  Pt informed of latest quant results, that there is a slight decrease and could possibly be a SAB.  Per VL, pt needs a rpt quant on Monday.  Pt requests an appt w/ SR or [facility] along with lab, pt scheduled an appt on Monday 04/01/12 @ 1315 w/ SR, pt will also have rpt quant that day as well.  Pt voices agreement.  Bleeding precautions given for over the weekend.

## 2012-04-01 ENCOUNTER — Other Ambulatory Visit: Payer: Self-pay | Admitting: Obstetrics and Gynecology

## 2012-04-01 ENCOUNTER — Encounter: Payer: Self-pay | Admitting: Obstetrics and Gynecology

## 2012-04-01 ENCOUNTER — Ambulatory Visit (INDEPENDENT_AMBULATORY_CARE_PROVIDER_SITE_OTHER): Payer: BC Managed Care – PPO | Admitting: Obstetrics and Gynecology

## 2012-04-01 ENCOUNTER — Ambulatory Visit (INDEPENDENT_AMBULATORY_CARE_PROVIDER_SITE_OTHER): Payer: BC Managed Care – PPO

## 2012-04-01 VITALS — BP 102/60 | Ht 60.0 in | Wt 113.0 lb

## 2012-04-01 DIAGNOSIS — O26849 Uterine size-date discrepancy, unspecified trimester: Secondary | ICD-10-CM

## 2012-04-01 DIAGNOSIS — O09299 Supervision of pregnancy with other poor reproductive or obstetric history, unspecified trimester: Secondary | ICD-10-CM

## 2012-04-01 LAB — US OB COMP LESS 14 WKS

## 2012-04-01 NOTE — Progress Notes (Signed)
Subjective:    Lisa Russo is a 37 y.o. female, [id number], who presents for follow up with h/o SAB and HCG results. Sab 11/2011. Cycles have returned to normal. Denies bleeding or pain.  The following portions of the patient's history were reviewed and updated as appropriate: allergies, current medications, past family history.  Review of Systems Pertinent items are noted in HPI.    Objective:    BP 102/60  Ht 5' (1.524 m)  Wt 113 lb (51.256 kg)  BMI 22.07 kg/m2  LMP 09/20/2011  Breastfeeding? No    Weight:  Wt Readings from Last 1 Encounters:  04/01/12 113 lb (51.256 kg)          BMI: Body mass index is 22.07 kg/(m^2).  General Appearance: Alert, appropriate appearance for age. No acute distress GYN exam: deferred  Ultrasound: viable SIUP at 7 weeks with EDD;11/18/12   Assessment:    viable SIUP with unexplained decrease in HCG    Plan:    Progesterone level today Repeat ultrasound in 2 weeks  Silverio Lay MD

## 2012-04-02 ENCOUNTER — Telehealth: Payer: Self-pay

## 2012-04-02 LAB — PROGESTERONE: Progesterone: 36.2 ng/mL

## 2012-04-02 NOTE — Telephone Encounter (Signed)
Received TC from Dr. Maryellen Pile. Pt wants lab results. Informed pt Progesterone is 36.2. Spoke with Herbert Seta states pt is pregnant since the progesterone is high and she will consult with Dr. Lynford Humphrey.

## 2012-04-03 ENCOUNTER — Telehealth: Payer: Self-pay

## 2012-04-03 NOTE — Telephone Encounter (Signed)
Pt advised  Ziva Nunziata, CMA  

## 2012-04-03 NOTE — Telephone Encounter (Signed)
Pt was given progesterone level of 36.2 by TG. Pt is wanting to know what the plan is from here, if she needs to have any special precautions.  Darien Ramus, CMA

## 2012-04-03 NOTE — Progress Notes (Signed)
Quick Note:  Nice progesterone level at 7 weeks. No need for supplementation ______

## 2012-04-03 NOTE — Telephone Encounter (Signed)
LVM for pt to return call  Per SR progesterone levels are great, no supplement needed f/u in 2 weeks with repeat viability u/s  Lisa Russo, CMA

## 2012-04-17 ENCOUNTER — Encounter: Payer: Self-pay | Admitting: Obstetrics and Gynecology

## 2012-04-17 ENCOUNTER — Ambulatory Visit: Payer: BC Managed Care – PPO

## 2012-04-17 ENCOUNTER — Ambulatory Visit: Payer: BC Managed Care – PPO | Admitting: Obstetrics and Gynecology

## 2012-04-17 VITALS — BP 102/58 | Wt 114.0 lb

## 2012-04-17 DIAGNOSIS — O09299 Supervision of pregnancy with other poor reproductive or obstetric history, unspecified trimester: Secondary | ICD-10-CM

## 2012-04-17 DIAGNOSIS — O3680X0 Pregnancy with inconclusive fetal viability, not applicable or unspecified: Secondary | ICD-10-CM

## 2012-04-17 DIAGNOSIS — Z331 Pregnant state, incidental: Secondary | ICD-10-CM

## 2012-04-17 NOTE — Progress Notes (Signed)
Subjective:    Lisa Russo is a 37 y.o. female, [id number], who presents for Gyn ultrasound because of Dating.Sab in September 2013.  The following portions of the patient's history were reviewed and updated as appropriate: allergies, current medications, past family history.  Objective:    BP 102/58  Wt 114 lb (51.71 kg)  LMP 02/12/2012    Weight:  Wt Readings from Last 1 Encounters:  04/17/12 114 lb (51.71 kg)          BMI: There is no height on file to calculate BMI.  ULTRASOUND: Uterus Normal    Adnexa Normal    Endometrium not measured    Free fluid: no    Other findings:  Singleton pregnancy, 9 weeks 3 days IUP, normal ovaries/adnexa, FHT 158 bpm   Assessment:    viable single pregnancy    Plan:    NOB workup to schedule Prenatal panel today Urine culture today Silverio Lay MD

## 2012-04-17 NOTE — Progress Notes (Signed)
[redacted]w[redacted]d

## 2012-04-18 LAB — PRENATAL PANEL VII
Antibody Screen: NEGATIVE
Basophils Absolute: 0 10*3/uL (ref 0.0–0.1)
Basophils Relative: 0 % (ref 0–1)
Eosinophils Absolute: 0.1 10*3/uL (ref 0.0–0.7)
Eosinophils Relative: 1 % (ref 0–5)
Lymphs Abs: 1.6 10*3/uL (ref 0.7–4.0)
MCH: 25.6 pg — ABNORMAL LOW (ref 26.0–34.0)
MCHC: 31.6 g/dL (ref 30.0–36.0)
MCV: 81.1 fL (ref 78.0–100.0)
Neutrophils Relative %: 73 % (ref 43–77)
Platelets: 289 10*3/uL (ref 150–400)
RDW: 14.3 % (ref 11.5–15.5)
Rubella: 5.27 Index — ABNORMAL HIGH (ref <0.90)

## 2012-04-18 LAB — US OB COMP LESS 14 WKS

## 2012-04-19 LAB — CULTURE, OB URINE: Colony Count: NO GROWTH

## 2012-04-24 ENCOUNTER — Encounter: Payer: Self-pay | Admitting: Certified Nurse Midwife

## 2012-04-24 ENCOUNTER — Ambulatory Visit: Payer: BC Managed Care – PPO | Admitting: Certified Nurse Midwife

## 2012-04-24 VITALS — BP 92/66 | Ht 62.0 in | Wt 114.0 lb

## 2012-04-24 DIAGNOSIS — Z3480 Encounter for supervision of other normal pregnancy, unspecified trimester: Secondary | ICD-10-CM

## 2012-04-24 DIAGNOSIS — O09529 Supervision of elderly multigravida, unspecified trimester: Secondary | ICD-10-CM | POA: Insufficient documentation

## 2012-04-24 DIAGNOSIS — IMO0002 Reserved for concepts with insufficient information to code with codable children: Secondary | ICD-10-CM

## 2012-04-24 DIAGNOSIS — R896 Abnormal cytological findings in specimens from other organs, systems and tissues: Secondary | ICD-10-CM

## 2012-04-24 NOTE — Progress Notes (Signed)
   Lisa Russo is being seen today for her first obstetrical visit at [redacted]w[redacted]d gestation by LMP.  Early [place] @ 9w 2d c/w with LMP dating.  She reports feeling tired, and not having much of an appetite.  No N/V. Discussed having sm frequent meals and good protein snack options.  Pt feeling anxiety about this pregnancy following an SAB in Sept 2013 -- reassurance given.    Her obstetrical history is significant for: Patient Active Problem List  Diagnosis  . LGSIL (low grade squamous intraepithelial dysplasia)  . Missed ab 12/06/11  . AMA (advanced maternal age) multigravida 35+    Relationship with FOB:  Pt is married.  She is employed  Feeding plan:  Breastfeeding.  BF her daughter -- went well, no mastitis or difficulties.   Pregnancy history fully reviewed.  The following portions of the patient's history were reviewed and updated as appropriate: allergies, current medications, past family history, past medical history, past social history, past surgical history and problem list.  Review of Systems Pertinent ROS is described in HPI   Objective:   BP 92/66  Ht 5\' 2"  (1.575 m)  Wt 114 lb (51.71 kg)  BMI 20.85 kg/m2  LMP 02/12/2012 Wt Readings from Last 1 Encounters:  04/24/12 114 lb (51.71 kg)   BMI: Body mass index is 20.85 kg/(m^2).  General: alert, cooperative and no distress HEENT: grossly normal  Thyroid: normal  Respiratory: clear to auscultation bilaterally Cardiovascular: regular rate and rhythm,  Breasts:  No dominant masses, nipples erect Gastrointestinal: soft, non-tender; no masses,  no organomegaly Extremities: extremities normal, no pain or edema Vaginal Bleeding: None  EXTERNAL GENITALIA: normal appearing vulva with no masses, tenderness or lesions VAGINA: no abnormal discharge or lesions CERVIX: no lesions or cervical motion tenderness; cervix closed, long, firm UTERUS: gravid and consistent with 10 weeks ADNEXA: no masses palpable and nontender OB EXAM  PELVIMETRY: appears adequate  FHR:  160  bpm  Assessment:    Pregnancy at  10w 2d H/o Recent SAB 11/2011 H/o Abnormal Pap - LSIL, mild dysplasia and HPV, CIN I Colpo 3/13: LGSIL; recommended rep pap in 6 mon -- was not done d/t pregnancy AMA  Plan:     Prenatal panel reviewed and discussed with the patient:  yes  Pap smear collected:  yes GC/Chlamydia collected:  yes Wet prep:  Neg Discussion of Genetic testing options: Request 1st trimester screening Prenatal vitamins recommended    Plan of care: Next visit:  4 weeks for ROB Other anticipated f/u:    2 weeks for 1st trimester genetic screening  Haroldine Laws, CNM, MSN

## 2012-04-24 NOTE — Progress Notes (Signed)
[redacted]w[redacted]d. New OB work up today. Hx of LGSIL. Pt has had flu vaccine

## 2012-04-25 LAB — OB RESULTS CONSOLE GC/CHLAMYDIA
Chlamydia: NEGATIVE
Gonorrhea: NEGATIVE

## 2012-04-26 LAB — PAP IG, CT-NG, RFX HPV ASCU

## 2012-05-01 ENCOUNTER — Encounter: Payer: BC Managed Care – PPO | Admitting: Obstetrics and Gynecology

## 2012-05-08 ENCOUNTER — Other Ambulatory Visit: Payer: Self-pay | Admitting: Obstetrics and Gynecology

## 2012-05-08 ENCOUNTER — Ambulatory Visit: Payer: BC Managed Care – PPO

## 2012-05-08 ENCOUNTER — Other Ambulatory Visit: Payer: Self-pay

## 2012-05-08 DIAGNOSIS — Z36 Encounter for antenatal screening of mother: Secondary | ICD-10-CM

## 2012-05-08 LAB — US OB COMP LESS 14 WKS

## 2012-05-11 ENCOUNTER — Inpatient Hospital Stay (HOSPITAL_COMMUNITY)
Admission: AD | Admit: 2012-05-11 | Discharge: 2012-05-11 | Disposition: A | Discharge status: Home / Self Care | Payer: BC Managed Care – PPO | Source: Ambulatory Visit | Attending: Obstetrics and Gynecology | Admitting: Obstetrics and Gynecology

## 2012-05-11 ENCOUNTER — Encounter (HOSPITAL_COMMUNITY): Payer: Self-pay | Admitting: Obstetrics and Gynecology

## 2012-05-11 DIAGNOSIS — O2650 Maternal hypotension syndrome, unspecified trimester: Secondary | ICD-10-CM | POA: Insufficient documentation

## 2012-05-11 DIAGNOSIS — O09529 Supervision of elderly multigravida, unspecified trimester: Secondary | ICD-10-CM | POA: Insufficient documentation

## 2012-05-11 LAB — URINALYSIS, ROUTINE W REFLEX MICROSCOPIC
Bilirubin Urine: NEGATIVE
Glucose, UA: NEGATIVE mg/dL
Ketones, ur: NEGATIVE mg/dL
Nitrite: NEGATIVE
Protein, ur: NEGATIVE mg/dL
pH: 7 (ref 5.0–8.0)

## 2012-05-11 LAB — COMPREHENSIVE METABOLIC PANEL
BUN: 7 mg/dL (ref 6–23)
Calcium: 8.8 mg/dL (ref 8.4–10.5)
GFR calc Af Amer: >90 mL/min (ref >90)
Glucose, Bld: 90 mg/dL (ref 70–99)
Total Protein: 6.3 g/dL (ref 6.0–8.3)

## 2012-05-11 LAB — CBC
HCT: 33.7 % — ABNORMAL LOW (ref 36.0–46.0)
Hemoglobin: 11 g/dL — ABNORMAL LOW (ref 12.0–15.0)
MCH: 26 pg (ref 26.0–34.0)
MCHC: 32.6 g/dL (ref 30.0–36.0)
RDW: 13.4 % (ref 11.5–15.5)

## 2012-05-11 LAB — GLUCOSE, CAPILLARY: Glucose-Capillary: 123 mg/dL — ABNORMAL HIGH (ref 70–99)

## 2012-05-11 LAB — TSH: TSH: 1.412 u[IU]/mL (ref 0.350–4.500)

## 2012-05-11 MED ORDER — LACTATED RINGERS IV BOLUS (SEPSIS)
1000.0000 mL | Freq: Once | INTRAVENOUS | Status: AC
  Administered 2012-05-11: 1000 mL via INTRAVENOUS

## 2012-05-11 NOTE — Progress Notes (Addendum)
Notified of pt's report of the events leading up to MAU visit, pt's request for CBG, labs drawn, and order clarification for CCUA.

## 2012-05-11 NOTE — Progress Notes (Signed)
Notified of pt's report of the events leading up to MAU visit, pt's request for CBG, labs drawn, and order clarification for CCUA. 

## 2012-05-11 NOTE — MAU Provider Note (Signed)
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, was standing at the desk for a time, and then was observed fainting.  A bystander caught her, and put her in a wheelchair.  She did not hit the floor and was only "out" for approx 10 sec.  Patient now just feeling cold and weak.  Denies HA, visual sx, N/V/dizziness, fever, recent illness, chest pain, SOB, or any other type of pain.  Reports had a similar syncopal episode during her previous pregnancy.  Reports now just "feeling cold and tired".  Had NOB w/u 1/29, with f/u visit this week for 1st trimester screening.  Patient Active Problem List  Diagnosis  . LGSIL (low grade squamous intraepithelial dysplasia)  . Missed ab 12/06/11  . AMA (advanced maternal age) multigravida 35+     Chief Complaint  Patient presents with  . Loss of Consciousness     OB History   Grav Para Term Preterm Abortions TAB SAB Ect Mult Living   4 1 1  2  0 1   1      Past Medical History  Diagnosis Date  . Dysuria 12/27/09  . Mass of axilla 05/02/10  . Wrist pain, right 05/02/10  . AMA (advanced maternal age) multigravida 35+   . Short stature   . Abnormal Pap smear 04/2011    LGSIL;Colpo done and rpt in 6 months  . Mild anemia   . SVD (spontaneous vaginal delivery) 2009-08-26    x 1    Past Surgical History  Procedure Laterality Date  . Wisdom tooth extraction  2000    all 4 removed  . Therapeutic abortion  1998-08-27  . Wrist surgery  03/2011    left  . Dilation and evacuation  12/08/2011    Procedure: DILATATION AND EVACUATION;  Surgeon: Esmeralda Arthur, MD;  Location: [facility];  Service: Gynecology;  Laterality: N/A;    Family History  Problem Relation Age of Onset  . Hypertension Father     Deceased 08/26/2009    History  Substance Use Topics  . Smoking status: Never Smoker   . Smokeless tobacco: Never Used  . Alcohol Use: No    Allergies: No Known  Allergies  Prescriptions prior to admission  Medication Sig Dispense Refill  . Prenatal Vit-Fe Sulfate-FA (PRENATAL VITAMIN PO) Take 1 tablet by mouth daily.         Physical Exam   Blood pressure 107/61, pulse 72, temperature 97.6 F (36.4 C), temperature source Oral, resp. rate 18, height 5\' 2"  (1.575 m), weight 114 lb (51.71 kg), last menstrual period 02/12/2012.  Appears tired, but is alert when questioned and oriented.  Chest clear Heart RRR without mumur Abd gravid, NT Fundal height approx 12-13 weeks Ext WNL  FHR 160s  Results for orders placed during the hospital encounter of 05/11/12 (from the past 24 hour(s))  URINALYSIS, ROUTINE W REFLEX MICROSCOPIC     Status: Abnormal   Collection Time    05/11/12  1:10 PM      Result Value Range   Color, Urine YELLOW  YELLOW   APPearance HAZY (*) CLEAR   Specific Gravity, Urine 1.015  1.005 - 1.030   pH 7.0  5.0 - 8.0   Glucose, UA NEGATIVE  NEGATIVE mg/dL   Hgb urine dipstick NEGATIVE  NEGATIVE   Bilirubin Urine NEGATIVE  NEGATIVE   Ketones, ur NEGATIVE  NEGATIVE mg/dL  Protein, ur NEGATIVE  NEGATIVE mg/dL   Urobilinogen, UA 0.2  0.0 - 1.0 mg/dL   Nitrite NEGATIVE  NEGATIVE   Leukocytes, UA NEGATIVE  NEGATIVE  CBC     Status: Abnormal   Collection Time    05/11/12  1:18 PM      Result Value Range   WBC 6.5  4.0 - 10.5 K/uL   RBC 4.23  3.87 - 5.11 MIL/uL   Hemoglobin 11.0 (*) 12.0 - 15.0 g/dL   HCT 47.8 (*) 29.5 - 62.1 %   MCV 79.7  78.0 - 100.0 fL   MCH 26.0  26.0 - 34.0 pg   MCHC 32.6  30.0 - 36.0 g/dL   RDW 30.8  65.7 - 84.6 %   Platelets 249  150 - 400 K/uL  COMPREHENSIVE METABOLIC PANEL     Status: Abnormal   Collection Time    05/11/12  1:18 PM      Result Value Range   Sodium 136  135 - 145 mEq/L   Potassium 3.4 (*) 3.5 - 5.1 mEq/L   Chloride 99  96 - 112 mEq/L   CO2 26  19 - 32 mEq/L   Glucose, Bld 90  70 - 99 mg/dL   BUN 7  6 - 23 mg/dL   Creatinine, Ser 9.62  0.50 - 1.10 mg/dL   Calcium 8.8  8.4  - 95.2 mg/dL   Total Protein 6.3  6.0 - 8.3 g/dL   Albumin 3.0 (*) 3.5 - 5.2 g/dL   AST 14  0 - 37 U/L   ALT 9  0 - 35 U/L   Alkaline Phosphatase 40  39 - 117 U/L   Total Bilirubin 0.2 (*) 0.3 - 1.2 mg/dL   GFR calc non Af Amer >90  >90 mL/min   GFR calc Af Amer >90  >90 mL/min  GLUCOSE, CAPILLARY     Status: Abnormal   Collection Time    05/11/12  1:48 PM      Result Value Range   Glucose-Capillary 123 (*) 70 - 99 mg/dL   Comment 1 Documented in Chart     Comment 2 Notify RN     Comment 3 PT HAD GINGERALE AT      Initiated IV hydration with LR.    ED Course  IUP at 12 5/7 weeks Syncopal episode  Plan: Consulted with Dr. Stefano Gaul. Recommended patient be transported to [facility] or [facility] for further evaluation (may need EKG, etc). Patient and husband decline transfer. Issues reviewed, with discussion of possibility that syncopal episode may be related to broader medical issues that cannot be adequately evaluated at [facility] (neurological and/or cardiac), with risks reviewed of inadequate evaluation of issues. Patient feeling better after IV hydration. Will go home, rest, push fluids, and monitor status. Will call with any recurrence of sx or any other issues. Has appt this week with Dr. Ivar Drape keep for f/u on today's occurrence.  Nigel Bridgeman CNM, [place] 05/11/2012 3:34 PM

## 2012-05-11 NOTE — MAU Note (Signed)
"  I passed out just a few minutes ago.  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  They told me I was out about 10 seconds, I didn't suffer any injuries.  They put me in a wheelchair and wheeled me out.  I ate a big meal just before going and I've been drinking."

## 2012-05-13 ENCOUNTER — Encounter: Payer: BC Managed Care – PPO | Admitting: Family Medicine

## 2012-05-13 ENCOUNTER — Telehealth: Payer: Self-pay | Admitting: Obstetrics and Gynecology

## 2012-05-13 NOTE — Telephone Encounter (Signed)
Message copied by Mason Jim on Mon May 13, 2012  8:30 AM ------      Message from: Cornelius Moras      Created: Sat May 11, 2012  4:20 PM      Regarding: Appt with ND       Patient thinks she has an appt with ND on Wednesday this week (2/19), but I only see a visit on Wednesday, 2/26.  She needs a visit this week to f/u on a syncopal episode on Saturday, so please call her on Monday to check on her and get an appt scheduled.      Thanks!      VL ------

## 2012-05-13 NOTE — Telephone Encounter (Signed)
Message copied by Darien Ramus on Mon May 13, 2012 10:11 AM ------      Message from: Cornelius Moras      Created: Sat May 11, 2012  4:57 PM       See note from me regarding this patient in EPIC mail to Triage      Let patient know her TSH was WNL      Thanks!      VL ------

## 2012-05-13 NOTE — Telephone Encounter (Signed)
Message copied by Mason Jim on Mon May 13, 2012  8:20 AM ------      Message from: Cornelius Moras      Created: Sat May 11, 2012  4:20 PM      Regarding: Appt with ND       Patient thinks she has an appt with ND on Wednesday this week (2/19), but I only see a visit on Wednesday, 2/26.  She needs a visit this week to f/u on a syncopal episode on Saturday, so please call her on Monday to check on her and get an appt scheduled.      Thanks!      VL ------

## 2012-05-13 NOTE — Telephone Encounter (Signed)
TC to pt. States is feeling great. Offered numerous appts for this week to F/U syncopal episode. Pt declined all appts due to work and school. Will keep appt 05/22/12. To call with any recurrence.

## 2012-05-13 NOTE — Telephone Encounter (Signed)
Pt has apt with LC, NP today. Will be advised of normal TSH at visit.  Darien Ramus, CMA

## 2012-05-22 ENCOUNTER — Encounter: Payer: BC Managed Care – PPO | Admitting: Obstetrics and Gynecology

## 2012-05-22 ENCOUNTER — Encounter: Payer: Self-pay | Admitting: Obstetrics and Gynecology

## 2012-05-22 ENCOUNTER — Ambulatory Visit: Payer: BC Managed Care – PPO | Admitting: Obstetrics and Gynecology

## 2012-05-22 VITALS — BP 98/58 | Wt 119.0 lb

## 2012-05-22 NOTE — Progress Notes (Signed)
Pt w/o complaint today.  

## 2012-05-22 NOTE — Progress Notes (Signed)
[redacted]w[redacted]d First trimester screen WNL AFP and anatomy @ [facility] Pt without c/o

## 2012-06-19 ENCOUNTER — Other Ambulatory Visit: Payer: Self-pay | Admitting: Obstetrics and Gynecology

## 2012-06-20 LAB — AFP, QUAD SCREEN
AFP: 49.8 IU/mL
Age Alone: 1:160 {titer}
Down Syndrome Scr Risk Est: 1:2670 {titer}
HCG, Total: 16790 m[IU]/mL
Interpretation-AFP: NEGATIVE
MoM for AFP: 1.14
MoM for hCG: 0.92
Open Spina bifida: NEGATIVE
Tri 18 Scr Risk Est: NEGATIVE
uE3 Mom: 1.29

## 2012-10-22 LAB — OB RESULTS CONSOLE GBS: GBS: NEGATIVE

## 2012-11-15 ENCOUNTER — Encounter (HOSPITAL_COMMUNITY): Payer: Self-pay | Admitting: *Deleted

## 2012-11-15 ENCOUNTER — Inpatient Hospital Stay (HOSPITAL_COMMUNITY)
Admission: AD | Admit: 2012-11-15 | Discharge: 2012-11-17 | DRG: 373 | Disposition: A | Discharge status: Home / Self Care | Payer: BC Managed Care – PPO | Source: Ambulatory Visit | Attending: Obstetrics and Gynecology | Admitting: Obstetrics and Gynecology

## 2012-11-15 ENCOUNTER — Inpatient Hospital Stay (HOSPITAL_COMMUNITY): Payer: BC Managed Care – PPO | Admitting: Anesthesiology

## 2012-11-15 ENCOUNTER — Encounter (HOSPITAL_COMMUNITY): Payer: Self-pay | Admitting: Anesthesiology

## 2012-11-15 DIAGNOSIS — O9902 Anemia complicating childbirth: Secondary | ICD-10-CM | POA: Diagnosis present

## 2012-11-15 DIAGNOSIS — IMO0002 Reserved for concepts with insufficient information to code with codable children: Secondary | ICD-10-CM | POA: Insufficient documentation

## 2012-11-15 DIAGNOSIS — L299 Pruritus, unspecified: Secondary | ICD-10-CM | POA: Insufficient documentation

## 2012-11-15 DIAGNOSIS — IMO0001 Reserved for inherently not codable concepts without codable children: Secondary | ICD-10-CM

## 2012-11-15 DIAGNOSIS — D649 Anemia, unspecified: Secondary | ICD-10-CM | POA: Diagnosis present

## 2012-11-15 DIAGNOSIS — O09529 Supervision of elderly multigravida, unspecified trimester: Secondary | ICD-10-CM | POA: Diagnosis present

## 2012-11-15 LAB — CBC
HCT: 37.2 % (ref 36.0–46.0)
MCHC: 33.1 g/dL (ref 30.0–36.0)
MCV: 76.2 fL — ABNORMAL LOW (ref 78.0–100.0)
Platelets: 221 10*3/uL (ref 150–400)
RDW: 16.2 % — ABNORMAL HIGH (ref 11.5–15.5)

## 2012-11-15 MED ORDER — OXYCODONE-ACETAMINOPHEN 5-325 MG PO TABS: 1.0000 | ORAL_TABLET | ORAL | Status: DC | PRN

## 2012-11-15 MED ORDER — OXYTOCIN BOLUS FROM INFUSION: 500.0000 mL | INTRAVENOUS | Status: DC

## 2012-11-15 MED ORDER — PHENYLEPHRINE 40 MCG/ML (10ML) SYRINGE FOR IV PUSH (FOR BLOOD PRESSURE SUPPORT): 80.0000 ug | PREFILLED_SYRINGE | INTRAVENOUS | Status: DC | PRN

## 2012-11-15 MED ORDER — LIDOCAINE HCL (PF) 1 % IJ SOLN
30.0000 mL | INTRAMUSCULAR | Status: DC | PRN
  Filled 2012-11-15: qty 30

## 2012-11-15 MED ORDER — LANOLIN HYDROUS EX OINT: TOPICAL_OINTMENT | CUTANEOUS | Status: DC | PRN

## 2012-11-15 MED ORDER — FENTANYL 2.5 MCG/ML BUPIVACAINE 1/10 % EPIDURAL INFUSION (WH - ANES)
14.0000 mL/h | INTRAMUSCULAR | Status: DC | PRN
  Administered 2012-11-15 (×2): 14 mL/h via EPIDURAL
  Filled 2012-11-15 (×2): qty 125

## 2012-11-15 MED ORDER — SENNOSIDES-DOCUSATE SODIUM 8.6-50 MG PO TABS
2.0000 | ORAL_TABLET | Freq: Every day | ORAL | Status: DC
  Administered 2012-11-16: 2 via ORAL

## 2012-11-15 MED ORDER — WITCH HAZEL-GLYCERIN EX PADS: 1.0000 "application " | MEDICATED_PAD | CUTANEOUS | Status: DC | PRN

## 2012-11-15 MED ORDER — SODIUM BICARBONATE 8.4 % IV SOLN
INTRAVENOUS | Status: DC | PRN
  Administered 2012-11-15: 5 mL via EPIDURAL

## 2012-11-15 MED ORDER — OXYTOCIN 40 UNITS IN LACTATED RINGERS INFUSION - SIMPLE MED
62.5000 mL/h | INTRAVENOUS | Status: DC
  Filled 2012-11-15: qty 1000

## 2012-11-15 MED ORDER — SIMETHICONE 80 MG PO CHEW: 80.0000 mg | CHEWABLE_TABLET | ORAL | Status: DC | PRN

## 2012-11-15 MED ORDER — EPHEDRINE 5 MG/ML INJ: 10.0000 mg | INTRAVENOUS | Status: DC | PRN

## 2012-11-15 MED ORDER — DIBUCAINE 1 % RE OINT: 1.0000 "application " | TOPICAL_OINTMENT | RECTAL | Status: DC | PRN

## 2012-11-15 MED ORDER — ZOLPIDEM TARTRATE 5 MG PO TABS: 5.0000 mg | ORAL_TABLET | Freq: Every evening | ORAL | Status: DC | PRN

## 2012-11-15 MED ORDER — ONDANSETRON HCL 4 MG/2ML IJ SOLN: 4.0000 mg | Freq: Four times a day (QID) | INTRAMUSCULAR | Status: DC | PRN

## 2012-11-15 MED ORDER — OXYCODONE-ACETAMINOPHEN 5-325 MG PO TABS
1.0000 | ORAL_TABLET | ORAL | Status: DC | PRN
  Administered 2012-11-16 – 2012-11-17 (×6): 1 via ORAL
  Filled 2012-11-15 (×6): qty 1

## 2012-11-15 MED ORDER — ONDANSETRON HCL 4 MG PO TABS: 4.0000 mg | ORAL_TABLET | ORAL | Status: DC | PRN

## 2012-11-15 MED ORDER — BENZOCAINE-MENTHOL 20-0.5 % EX AERO
1.0000 "application " | INHALATION_SPRAY | CUTANEOUS | Status: DC | PRN
  Administered 2012-11-15: 1 via TOPICAL
  Filled 2012-11-15 (×2): qty 56

## 2012-11-15 MED ORDER — IBUPROFEN 600 MG PO TABS
600.0000 mg | ORAL_TABLET | Freq: Four times a day (QID) | ORAL | Status: DC
  Administered 2012-11-15 – 2012-11-17 (×6): 600 mg via ORAL
  Filled 2012-11-15 (×6): qty 1

## 2012-11-15 MED ORDER — ACETAMINOPHEN 325 MG PO TABS: 650.0000 mg | ORAL_TABLET | ORAL | Status: DC | PRN

## 2012-11-15 MED ORDER — PRENATAL MULTIVITAMIN CH
1.0000 | ORAL_TABLET | Freq: Every day | ORAL | Status: DC
  Administered 2012-11-16: 1 via ORAL
  Filled 2012-11-15: qty 1

## 2012-11-15 MED ORDER — TETANUS-DIPHTH-ACELL PERTUSSIS 5-2.5-18.5 LF-MCG/0.5 IM SUSP
0.5000 mL | Freq: Once | INTRAMUSCULAR | Status: AC
  Administered 2012-11-16: 0.5 mL via INTRAMUSCULAR

## 2012-11-15 MED ORDER — DIPHENHYDRAMINE HCL 50 MG/ML IJ SOLN
12.5000 mg | INTRAMUSCULAR | Status: DC | PRN
  Administered 2012-11-15 (×2): 12.5 mg via INTRAVENOUS
  Filled 2012-11-15: qty 1

## 2012-11-15 MED ORDER — LACTATED RINGERS IV SOLN: 500.0000 mL | Freq: Once | INTRAVENOUS | Status: DC

## 2012-11-15 MED ORDER — DIPHENHYDRAMINE HCL 25 MG PO CAPS
25.0000 mg | ORAL_CAPSULE | Freq: Four times a day (QID) | ORAL | Status: DC | PRN
  Administered 2012-11-16: 25 mg via ORAL
  Filled 2012-11-15: qty 1

## 2012-11-15 MED ORDER — IBUPROFEN 600 MG PO TABS: 600.0000 mg | ORAL_TABLET | Freq: Four times a day (QID) | ORAL | Status: DC | PRN

## 2012-11-15 MED ORDER — CITRIC ACID-SODIUM CITRATE 334-500 MG/5ML PO SOLN: 30.0000 mL | ORAL | Status: DC | PRN

## 2012-11-15 MED ORDER — LACTATED RINGERS IV SOLN
INTRAVENOUS | Status: DC
  Administered 2012-11-15: 11:00:00 via INTRAVENOUS

## 2012-11-15 MED ORDER — PHENYLEPHRINE 40 MCG/ML (10ML) SYRINGE FOR IV PUSH (FOR BLOOD PRESSURE SUPPORT)
80.0000 ug | PREFILLED_SYRINGE | INTRAVENOUS | Status: DC | PRN
  Filled 2012-11-15: qty 5

## 2012-11-15 MED ORDER — EPHEDRINE 5 MG/ML INJ
10.0000 mg | INTRAVENOUS | Status: DC | PRN
  Filled 2012-11-15: qty 4

## 2012-11-15 MED ORDER — LACTATED RINGERS IV SOLN: 500.0000 mL | INTRAVENOUS | Status: DC | PRN

## 2012-11-15 MED ORDER — ONDANSETRON HCL 4 MG/2ML IJ SOLN: 4.0000 mg | INTRAMUSCULAR | Status: DC | PRN

## 2012-11-15 NOTE — MAU Note (Signed)
PT ARRIVED IN LOBBY - VERY UNCOMFORTABLE- BROUGHT TO RM M6.   FHR- 160, G2P1- VAG. EDC-8-25.  GBS- NEG.    VE 7 CM, 90, VTX, -1,  INTACT, NO BLEEDING.     CALLED JENNIFER OAKLEY, CNM-  ADMITTED PT.

## 2012-11-15 NOTE — H&P (Signed)
Lisa Russo is a 37 y.o. female, G4P1021 at [redacted]w[redacted]d, presenting for labor check.  Denies VB, LOF, recent fever, resp or GI c/o's, UTI or PIH s/s. GFM. Desires epidural.  Patient Active Problem List   Diagnosis Date Noted  . AMA (advanced maternal age) multigravida 35+ 04/24/2012  . Missed ab 12/06/11 12/06/2011  . LGSIL (low grade squamous intraepithelial dysplasia) 09/26/2011    History of present pregnancy: Patient entered care at 10 weeks.   EDC of 11/18/12 was established by LMP.   Anatomy scan:  18 weeks, with normal findings and an posterior placenta.   Additional [place] evaluations:  [redacted]w[redacted]d for poor weight gain - EFW 77.6th%ile; AFI 60th%ile.   Significant prenatal events:  Pruritis -   Hydroxyzine  Last evaluation:  11/12/12 at 102w1d   3 cm / 70% / -3  OB History   Grav Para Term Preterm Abortions TAB SAB Ect Mult Living   4 1 1  2  0 1   1     Past Medical History  Diagnosis Date  . Dysuria 12/27/09  . Mass of axilla 05/02/10  . Wrist pain, right 05/02/10  . AMA (advanced maternal age) multigravida 35+   . Short stature   . Abnormal Pap smear 04/2011    LGSIL;Colpo done and rpt in 6 months  . Mild anemia   . SVD (spontaneous vaginal delivery) 2011    x 1   Past Surgical History  Procedure Laterality Date  . Wisdom tooth extraction  2000    all 4 removed  . Therapeutic abortion  2000  . Wrist surgery  03/2011    left  . Dilation and evacuation  12/08/2011    Procedure: DILATATION AND EVACUATION;  Surgeon: Esmeralda Arthur, MD;  Location: [facility];  Service: Gynecology;  Laterality: N/A;   Family History: family history includes Hypertension in her father. Social History:  reports that she has never smoked. She has never used smokeless tobacco. She reports that she does not drink alcohol or use illicit drugs.   Prenatal Transfer Tool  Maternal Diabetes: No Genetic Screening: Normal Maternal Ultrasounds/Referrals: Normal Fetal Ultrasounds or other Referrals:  None Maternal  Substance Abuse:  No Significant Maternal Medications:  None Significant Maternal Lab Results: Lab values include: Group B Strep negative    ROS: see HPI above, all other systems are negative  No Known Allergies     Last menstrual period 02/12/2012.  Chest clear Heart RRR without murmur Abd gravid, NT Ext: WNL  FHR: Cat I UCs:  Q 2 - 3 min  Prenatal labs: ABO, Rh: B/POS/-- (01/22 1731) Antibody: NEG (01/22 1731) Rubella:   Immune RPR: NON REAC (01/22 1731)  HBsAg: NEGATIVE (01/22 1731)  HIV: NON REACTIVE (01/22 1731)  GBS:  Neg Sickle cell/Hgb electrophoresis:  n/a Pap:  04/24/12  WNL GC:  Neg Chlamydia:  Neg Genetic screenings:  Quad neg Glucola:  101 Other:  08/30/12 Bile Acid 8 - WNL  Assessment/Plan: IUP at [redacted]w[redacted]d Active labor GBS neg  Admit to BS per c/w Dr. Estanislado Pandy as attending MD Routine [facility] orders   Rowan Blase, MSN 11/15/2012, 5:54 AM

## 2012-11-15 NOTE — Progress Notes (Signed)
Reports itching

## 2012-11-15 NOTE — Op Note (Signed)
Delivery Note At 5:33 PM a living female was delivered via Vaginal, Spontaneous Delivery (Presentation: Left Occiput Anterior).  APGAR: 9,9 ; weight pending   Placenta status:Spontaneous and intact , .  Cord: 3 vessels with the following complications:True Knot.  Cord pH: not done  Anesthesia: Epidural  Episiotomy: None Lacerations: 2nd degree Suture Repair: 3.0 vicryl Est. Blood Loss (mL): 200cc  Mom to postpartum.  Baby to nursery-stable.  Stpehanie Montroy P 11/15/2012, 6:50 PM

## 2012-11-15 NOTE — Anesthesia Preprocedure Evaluation (Addendum)
Anesthesia Evaluation  Patient identified by MRN, date of birth, ID band Patient awake    Reviewed: Allergy & Precautions, H&P , Patient's Chart, lab work & pertinent test results  Airway Mallampati: IV TM Distance: >3 FB Neck ROM: full  Mouth opening: Limited Mouth Opening  Dental  (+) Teeth Intact   Pulmonary  breath sounds clear to auscultation        Cardiovascular Rhythm:regular Rate:Normal     Neuro/Psych    GI/Hepatic   Endo/Other    Renal/GU      Musculoskeletal   Abdominal   Peds  Hematology   Anesthesia Other Findings       Reproductive/Obstetrics (+) Pregnancy                          Anesthesia Physical Anesthesia Plan  ASA: II  Anesthesia Plan: Epidural   Post-op Pain Management:    Induction:   Airway Management Planned:   Additional Equipment:   Intra-op Plan:   Post-operative Plan:   Informed Consent: I have reviewed the patients History and Physical, chart, labs and discussed the procedure including the risks, benefits and alternatives for the proposed anesthesia with the patient or authorized representative who has indicated his/her understanding and acceptance.   Dental Advisory Given  Plan Discussed with:   Anesthesia Plan Comments: (Labs checked- platelets confirmed with RN in room. Fetal heart tracing, per RN, reported to be stable enough for sitting procedure. Discussed epidural, and patient consents to the procedure:  included risk of possible headache,backache, failed block, allergic reaction, and nerve injury. This patient was asked if she had any questions or concerns before the procedure started. )        Anesthesia Quick Evaluation

## 2012-11-15 NOTE — Anesthesia Procedure Notes (Signed)

## 2012-11-15 NOTE — Progress Notes (Signed)
Ninetta R Lynam is a 37 y.o. R6E4540 at [redacted]w[redacted]d by LMP admitted for active labor  Subjective: Comfortable with epidural in place.  Denies pelvic pressure   Objective: BP 124/76  Pulse 84  Temp(Src) 97.8 F (36.6 C) (Oral)  Resp 18  Ht 5\' 2"  (1.575 m)  Wt 150 lb (68.04 kg)  BMI 27.43 kg/m2  SpO2 99%  LMP 02/12/2012      FHT:  FHR: 140 bpm, variability: moderate,  accelerations:  Present,  decelerations:  Absent UC:   regular, every 2-3 minutes SVE:   Dilation: 9 Effacement (%): 90 Station: -2 Exam by:: Paul Dykes RNC  Labs: Lab Results  Component Value Date   WBC 8.0 11/15/2012   HGB 12.3 11/15/2012   HCT 37.2 11/15/2012   MCV 76.2* 11/15/2012   PLT 221 11/15/2012    Assessment / Plan: Spontaneous labor, progressing normally  Labor: Progressing normally Preeclampsia:  no signs or symptoms of toxicity Fetal Wellbeing:  Category I Pain Control:  Epidural I/D:  n/a Anticipated MOD:  NSVD  Kameran Mcneese P 11/15/2012, 12:26 PM

## 2012-11-16 ENCOUNTER — Encounter (HOSPITAL_COMMUNITY): Payer: Self-pay | Admitting: *Deleted

## 2012-11-16 LAB — CBC
HCT: 28.6 % — ABNORMAL LOW (ref 36.0–46.0)
MCHC: 32.9 g/dL (ref 30.0–36.0)
MCV: 76.1 fL — ABNORMAL LOW (ref 78.0–100.0)
RDW: 15.9 % — ABNORMAL HIGH (ref 11.5–15.5)

## 2012-11-16 MED ORDER — LIDOCAINE 1%/NA BICARB 0.1 MEQ INJECTION
0.8000 mL | INJECTION | Freq: Once | INTRAVENOUS | Status: DC
  Filled 2012-11-16: qty 1

## 2012-11-16 MED ORDER — EPINEPHRINE TOPICAL FOR CIRCUMCISION 0.1 MG/ML: 1.0000 [drp] | TOPICAL | Status: DC | PRN

## 2012-11-16 MED ORDER — ACETAMINOPHEN FOR CIRCUMCISION 160 MG/5 ML
40.0000 mg | Freq: Once | ORAL | Status: DC
  Filled 2012-11-16: qty 2.5

## 2012-11-16 MED ORDER — FERROUS SULFATE 325 (65 FE) MG PO TABS
325.0000 mg | ORAL_TABLET | Freq: Every day | ORAL | Status: DC
  Administered 2012-11-16: 325 mg via ORAL
  Filled 2012-11-16: qty 1

## 2012-11-16 MED ORDER — ACETAMINOPHEN FOR CIRCUMCISION 160 MG/5 ML
40.0000 mg | ORAL | Status: DC | PRN
  Filled 2012-11-16: qty 2.5

## 2012-11-16 MED ORDER — SUCROSE 24% NICU/PEDS ORAL SOLUTION
0.5000 mL | OROMUCOSAL | Status: DC | PRN
  Filled 2012-11-16: qty 0.5

## 2012-11-16 NOTE — Anesthesia Postprocedure Evaluation (Signed)
  Anesthesia Post-op Note  Anesthesia Post Note  Patient: Lisa Russo  Procedure(s) Performed: * No procedures listed *  Anesthesia type: Epidural  Patient location: Mother/Baby  Post pain: Pain level controlled  Post assessment: Post-op Vital signs reviewed  Last Vitals:  Filed Vitals:   11/16/12 0621  BP: 95/69  Pulse: 70  Temp: 36.6 C  Resp: 18    Post vital signs: Reviewed  Level of consciousness:alert  Complications: No apparent anesthesia complications

## 2012-11-16 NOTE — Progress Notes (Signed)
Post Partum Day 1 Subjective: Reports feeling well.  Ambulating, voiding and tol po liquids and solids without difficulty.  Denies weakness or dizziness.  Working on breastfeeding.  Reports minimal perineal pain which is relieved with medications.  Objective: Blood pressure 95/69, pulse 70, temperature 97.8 F (36.6 C), temperature source Oral, resp. rate 18, height 5\' 2"  (1.575 m), weight 68.04 kg (150 lb), last menstrual period 02/12/2012, SpO2 99.00%.  Physical Exam:  General: alert, cooperative and no distress Heart:  RRR Lungs:  CTA bilat Breasts:  Soft Abd:  Soft, NT with pos BS x 4 quads. Lochia: appropriate, sm rubra Uterine Fundus: firm, NT, 2 below umb. Incision: Perineum well approximated DVT Evaluation: No evidence of DVT seen on physical exam. Negative Homan's sign bilat. No significant calf/ankle edema.   Recent Labs  11/15/12 0605 11/16/12 0620  HGB 12.3 9.4*  HCT 37.2 28.6*    Assessment/Plan: Stable s/p vaginal delivery Asymptomatic anemia  Continue current care. Begin ferrous sulfate due to anemia. Anticipate discharge tomorrow.     LOS: 1 day   Regina Coppolino O. 11/16/2012, 1:58 PM

## 2012-11-17 MED ORDER — OXYCODONE-ACETAMINOPHEN 5-325 MG PO TABS: 1.0000 | ORAL_TABLET | ORAL | Status: AC | PRN

## 2012-11-17 MED ORDER — IBUPROFEN 600 MG PO TABS: 600.0000 mg | ORAL_TABLET | Freq: Four times a day (QID) | ORAL | Status: AC

## 2012-11-17 NOTE — Discharge Summary (Signed)
Vaginal Delivery Discharge Summary  Lisa Russo  DOB:    06-03-1975 MRN:    [id number] CSN:    [id number]  Date of admission:                  11/15/12  Date of discharge:                   11/17/12  Procedures this admission: SVD with a 2nd degree laceration  Date of Delivery: 11/15/12 by Dr. Pennie Rushing  Newborn Data:  Live born female  Birth Weight: 8 lb 3.4 oz (3725 g) APGAR: 9, 9  Home with mother. Circumcision Plan: Inpatient  History of Present Illness:  Lisa Russo is a 37 y.o. female, [phone], who presents at [redacted]w[redacted]d weeks gestation. The patient has been followed at the [facility] and Gynecology division of [facility]. She was admitted onset of labor. Her pregnancy has been complicated by:   Patient Active Problem List   Diagnosis Date Noted  . NSVD (normal spontaneous vaginal delivery) 11/16/2012  . Failure to gain weight 11/15/2012  . Pruritic dermatitis 11/15/2012  . AMA (advanced maternal age) multigravida 35+ 04/24/2012  . Missed ab 12/06/11 12/06/2011  . LGSIL (low grade squamous intraepithelial dysplasia) 09/26/2011    Hospital course:  The patient was admitted for active labor.   Her labor was not complicated. She proceeded to have a vaginal delivery of a healthy infant. Her delivery was not complicated, true knot noted at delivery. Her postpartum course was complicated by asymptomatic anemia.  She was discharged to home on postpartum day 2 doing well.  Feeding:  breast  Contraception:  Nexplanon  Discharge hemoglobin:  Hemoglobin  Date Value Range Status  11/16/2012 9.4* 12.0 - 15.0 g/dL Final     REPEATED TO VERIFY     DELTA CHECK NOTED     HCT  Date Value Range Status  11/16/2012 28.6* 36.0 - 46.0 % Final    Discharge Physical Exam:   General: alert, cooperative and no distress Lochia: appropriate Uterine Fundus: firm Incision: healing well DVT Evaluation: No evidence of DVT seen on physical  exam. Negative Homan's sign.  Intrapartum Procedures: spontaneous vaginal delivery Postpartum Procedures: none Complications-Operative and Postpartum: 2nd degree perineal laceration  Discharge Diagnoses: Term Pregnancy-delivered and asymptomatic anemia  Discharge Information:  Activity:           pelvic rest Diet:                routine Medications: PNV, Ibuprofen, Iron and Percocet Condition:      stable Instructions:  refer to practice specific booklet Discharge to: home  Follow-up Information   Follow up with [facility]. Schedule an appointment as soon as possible for a visit in 5 weeks. (Call for any questions or concerns.)    Specialty:  Obstetrics and Gynecology   Contact information:   [address] [place] [address] [phone]       Haroldine Laws 11/17/2012

## 2013-08-17 ENCOUNTER — Emergency Department (HOSPITAL_COMMUNITY)
Admission: EM | Admit: 2013-08-17 | Discharge: 2013-08-17 | Disposition: A | Discharge status: Home / Self Care | Payer: BC Managed Care – PPO | Source: Home / Self Care | Attending: Emergency Medicine | Admitting: Emergency Medicine

## 2013-08-17 ENCOUNTER — Encounter (HOSPITAL_COMMUNITY): Payer: Self-pay | Admitting: Emergency Medicine

## 2013-08-17 DIAGNOSIS — J020 Streptococcal pharyngitis: Secondary | ICD-10-CM

## 2013-08-17 LAB — POCT RAPID STREP A: STREPTOCOCCUS, GROUP A SCREEN (DIRECT): POSITIVE — AB

## 2013-08-17 MED ORDER — DEXAMETHASONE SODIUM PHOSPHATE 10 MG/ML IJ SOLN
10.0000 mg | Freq: Once | INTRAMUSCULAR | Status: AC
Start: 2013-08-17 — End: 2013-08-17
  Administered 2013-08-17: 10 mg via INTRAMUSCULAR

## 2013-08-17 MED ORDER — IBUPROFEN 100 MG/5ML PO SUSP
600.0000 mg | Freq: Once | ORAL | Status: AC
  Administered 2013-08-17: 600 mg via ORAL

## 2013-08-17 MED ORDER — PENICILLIN G BENZATHINE 1200000 UNIT/2ML IM SUSP
INTRAMUSCULAR | Status: AC
  Filled 2013-08-17: qty 2

## 2013-08-17 MED ORDER — DEXAMETHASONE SODIUM PHOSPHATE 10 MG/ML IJ SOLN
INTRAMUSCULAR | Status: AC
  Filled 2013-08-17: qty 1

## 2013-08-17 MED ORDER — PENICILLIN G BENZATHINE 1200000 UNIT/2ML IM SUSP
1.2000 10*6.[IU] | Freq: Once | INTRAMUSCULAR | Status: AC
  Administered 2013-08-17: 1.2 10*6.[IU] via INTRAMUSCULAR

## 2013-08-17 NOTE — ED Notes (Signed)
S/S allergic reaction reviewed with pt.  Instructed to notify any staff member immediately if she experiences any.

## 2013-08-17 NOTE — ED Notes (Signed)
C/O severe sore throat, feeling of swelling in throat x 2 days.  Had subjective fever 2 days ago.  Has been taking Tyl, honey, and lime.

## 2013-08-17 NOTE — ED Provider Notes (Signed)
  Chief Complaint   Chief Complaint  Patient presents with  . Sore Throat    History of Present Illness   Lisa Russo is a 38 year old female who has had a three-day history of sore throat, pain on swallowing, swollen glands, fever up to 100, chills, and rhinorrhea. She denies any headache, cough, or GI symptoms. No known exposure to strep.   Review of Systems   Other than as noted above, the patient denies any of the following symptoms. Systemic:  No fever, chills, sweats, myalgias, or headache. Eye:  No redness, pain or drainage. ENT:  No earache, nasal congestion, sneezing, rhinorrhea, sinus pressure, sinus pain, or post nasal drip. Lungs:  No cough, sputum production, wheezing, shortness of breath, or chest pain. GI:  No abdominal pain, nausea, vomiting, or diarrhea. Skin:  No rash.  PMFSH   Past medical history, family history, social history, meds, and allergies were reviewed.   Physical Exam     Vital signs:  BP 112/80  Pulse 86  Temp(Src) 98.7 F (37.1 C) (Oral)  Resp 18  SpO2 100%  LMP 08/14/2013  Breastfeeding? No General:  Alert, in no distress. Phonation was normal, no drooling, and patient was able to handle secretions well.  Eye:  No conjunctival injection or drainage. Lids were normal. ENT:  TMs and canals were normal, without erythema or inflammation.  Nasal mucosa was clear and uncongested, without drainage.  Mucous membranes were moist.  Exam of pharynx shows erythema but no swelling or exudate.  There were no oral ulcerations or lesions. There was no bulging of the tonsillar pillars, and the uvula was midline. Neck:  Supple, no adenopathy, tenderness or mass. Lungs:  No respiratory distress.  Lungs were clear to auscultation, without wheezes, rales or rhonchi.  Breath sounds were clear and equal bilaterally.  Heart:  Regular rhythm, without gallops, murmers or rubs. Skin:  Clear, warm, and dry, without rash or lesions.  Labs   Results for orders  placed during the hospital encounter of 08/17/13  POCT RAPID STREP A ([facility]RG CARE ONLY)      Result Value Ref Range   Streptococcus, Group A Screen (Direct) POSITIVE (*) NEGATIVE    Course in Urgent Care Center   The patient was given the following meds: Medications  ibuprofen (ADVIL,MOTRIN) 100 MG/5ML suspension 600 mg (600 mg Oral Given 08/17/13 1128)  penicillin g benzathine (BICILLIN LA) 1200000 UNIT/2ML injection 1.2 Million Units (1.2 Million Units Intramuscular Given 08/17/13 1151)  dexamethasone (DECADRON) injection 10 mg (10 mg Intramuscular Given 08/17/13 1150)   Assessment   The encounter diagnosis was Strep throat.  There is no evidence of a peritonsillar abscess, retropharyngeal abscess, or epiglottitis.    Plan     1.  Meds:  The following meds were prescribed:   Discharge Medication List as of 08/17/2013 12:13 PM      2.  Patient Education/Counseling:  The patient was given appropriate handouts, self care instructions, and instructed in symptomatic relief, including hot saline gargles, throat lozenges, infectious precautions, and need to trade out toothbrush.    3.  Follow up:  The patient was told to follow up here if no better in 3 to 4 days, or sooner if becoming worse in any way, and given some red flag symptoms such as difficulty swallowing or breathing which would prompt immediate return.       Reuben Likes, MD 08/17/13 2157

## 2013-08-17 NOTE — Discharge Instructions (Signed)
Strep Throat  Strep throat is an infection of the throat caused by a bacteria named Streptococcus pyogenes. Your caregiver may call the infection streptococcal "tonsillitis" or "pharyngitis" depending on whether there are signs of inflammation in the tonsils or back of the throat. Strep throat is most common in children aged 39 15 years during the cold months of the year, but it can occur in people of any age during any season. This infection is spread from person to person (contagious) through coughing, sneezing, or other close contact.  SYMPTOMS   · Fever or chills.  · Painful, swollen, red tonsils or throat.  · Pain or difficulty when swallowing.  · White or yellow spots on the tonsils or throat.  · Swollen, tender lymph nodes or "glands" of the neck or under the jaw.  · Red rash all over the body (rare).  DIAGNOSIS   Many different infections can cause the same symptoms. A test must be done to confirm the diagnosis so the right treatment can be given. A "rapid strep test" can help your caregiver make the diagnosis in a few minutes. If this test is not available, a light swab of the infected area can be used for a throat culture test. If a throat culture test is done, results are usually available in a day or two.  TREATMENT   Strep throat is treated with antibiotic medicine.  HOME CARE INSTRUCTIONS   · Gargle with 1 tsp of salt in 1 cup of warm water, 3 4 times per day or as needed for comfort.  · Family members who also have a sore throat or fever should be tested for strep throat and treated with antibiotics if they have the strep infection.  · Make sure everyone in your household washes their hands well.  · Do not share food, drinking cups, or personal items that could cause the infection to spread to others.  · You may need to eat a soft food diet until your sore throat gets better.  · Drink enough water and fluids to keep your urine clear or pale yellow. This will help prevent dehydration.  · Get plenty of  rest.  · Stay home from school, daycare, or work until you have been on antibiotics for 24 hours.  · Only take over-the-counter or prescription medicines for pain, discomfort, or fever as directed by your caregiver.  · If antibiotics are prescribed, take them as directed. Finish them even if you start to feel better.  SEEK MEDICAL CARE IF:   · The glands in your neck continue to enlarge.  · You develop a rash, cough, or earache.  · You cough up green, yellow-brown, or bloody sputum.  · You have pain or discomfort not controlled by medicines.  · Your problems seem to be getting worse rather than better.  SEEK IMMEDIATE MEDICAL CARE IF:   · You develop any new symptoms such as vomiting, severe headache, stiff or painful neck, chest pain, shortness of breath, or trouble swallowing.  · You develop severe throat pain, drooling, or changes in your voice.  · You develop swelling of the neck, or the skin on the neck becomes red and tender.  · You have a fever.  · You develop signs of dehydration, such as fatigue, dry mouth, and decreased urination.  · You become increasingly sleepy, or you cannot wake up completely.  Document Released: 03/10/2000 Document Revised: 02/28/2012 Document Reviewed: 05/12/2010  [employer]® Patient Information ©2014 [employer].

## 2014-01-26 ENCOUNTER — Encounter (HOSPITAL_COMMUNITY): Payer: Self-pay | Admitting: Emergency Medicine

## 2014-12-14 ENCOUNTER — Other Ambulatory Visit: Payer: Self-pay

## 2014-12-14 DIAGNOSIS — Z1231 Encounter for screening mammogram for malignant neoplasm of breast: Secondary | ICD-10-CM

## 2014-12-18 ENCOUNTER — Ambulatory Visit
Admission: RE | Admit: 2014-12-18 | Discharge: 2014-12-18 | Disposition: A | Discharge status: Home / Self Care | Payer: BC Managed Care – PPO | Source: Ambulatory Visit

## 2014-12-18 DIAGNOSIS — Z1231 Encounter for screening mammogram for malignant neoplasm of breast: Secondary | ICD-10-CM

## 2019-05-17 ENCOUNTER — Ambulatory Visit: Payer: BC Managed Care – PPO | Attending: Internal Medicine

## 2019-05-17 DIAGNOSIS — Z23 Encounter for immunization: Secondary | ICD-10-CM | POA: Insufficient documentation

## 2019-05-17 NOTE — Progress Notes (Signed)
   Covid-19 Vaccination Clinic  Name:  Lisa Russo    MRN: [id number] DOB: 08/15/1975  05/17/2019  [name] was observed post Covid-19 immunization for 15 minutes without incidence. She was provided with Vaccine Information Sheet and instruction to access the V-Safe system.   [name] was instructed to call 911 with any severe reactions post vaccine: Marland Kitchen Difficulty breathing  . Swelling of your face and throat  . A fast heartbeat  . A bad rash all over your body  . Dizziness and weakness    Immunizations Administered    Name Date Dose VIS Date Route   Pfizer COVID-19 Vaccine 05/17/2019  2:01 PM 0.3 mL 03/07/2019 Intramuscular   Manufacturer: [employer], [employer]   Lot: [id number]   NDC: [address]-7

## 2019-06-10 ENCOUNTER — Ambulatory Visit: Payer: BC Managed Care – PPO | Attending: Internal Medicine

## 2019-06-10 DIAGNOSIS — Z23 Encounter for immunization: Secondary | ICD-10-CM

## 2019-06-10 NOTE — Progress Notes (Signed)
   Covid-19 Vaccination Clinic  Name:  ANAIZ QAZI    MRN: [id number] DOB: 30-Dec-1975  06/10/2019  Ms. Mackowiak was observed post Covid-19 immunization for 15 minutes without incident. She was provided with Vaccine Information Sheet and instruction to access the V-Safe system.   Ms. Kowaleski was instructed to call 911 with any severe reactions post vaccine: Marland Kitchen Difficulty breathing  . Swelling of face and throat  . A fast heartbeat  . A bad rash all over body  . Dizziness and weakness   Immunizations Administered    Name Date Dose VIS Date Route   Pfizer COVID-19 Vaccine 06/10/2019  1:52 PM 0.3 mL 03/07/2019 Intramuscular   Manufacturer: [employer], [employer]   Lot: [id number]   NDC: [address]-5

## 2022-08-15 LAB — AMB RESULTS CONSOLE CBG: Glucose: 106

## 2022-08-15 NOTE — Progress Notes (Signed)
Pt does not have PCP, wants new one. Given list of [facility] clinics to call once she decides where she wants to go. Pt does not have insurance.

## 2022-08-17 NOTE — Progress Notes (Signed)
The patient attended 08/15/22 screening event where her screening results were wnl. At the event the patient noted she did not have a PCP or insurance. Patient was given a list of community clinics. Per chart review the pcp in the system hasn't been seen within the last twelve months. Chart review also indicates no future appt.   Spoke with the pt on 08/17/22 and she informed me she needed affordable insurance. She had already applied for financial assistance and medicaid and did not qualify. I offered to schedule her a primary appt and she declined. She stated she did not want to receive a bill. I told her I would mail out additional insurance resources and she asked for them to be emailed instead. She will schedule an appt once she secures affordable insurance. A sixty day follow up is needed.

## 2022-10-18 NOTE — Progress Notes (Signed)
The patient attended 08/15/22 screening event where her screening results were wnl. At the event the patient noted she did not have a PCP or insurance. Patient was given a list of community clinics. Per chart review the pcp in the system hasn't been seen within the last twelve months. Chart review also indicates no future appt.    Spoke with the pt on 08/17/22 and she informed me she needed affordable insurance. She had already applied for financial assistance and medicaid and did not qualify. I offered to schedule her a primary appt and she declined. She stated she did not want to receive a bill.   Spoke with pt on 7/24 and pt stated she still had not found affordable insurance. She stated she was still shopping around. I put in an [id number] referral for primary care and affordable insurance for the pt. The pt will be on the look out to see if anyone reaches out to her from the organization for affordable healthcare.   The [id number] referral was accepted on 8/5 and marked as "resolved" in the system. A six month follow up will be done to see if the pt ever obtained affordable health care/ Primary care or insurance with the help of the referral agency.

## 2023-02-08 ENCOUNTER — Encounter: Payer: Self-pay | Admitting: *Deleted

## 2023-02-08 NOTE — Progress Notes (Signed)
Pt attended 08/15/22 screening event where her b/p was 113/76 and her blood sugar was 106. Pt did not identify a PCP or any SDOH insecurities and did note she did not have insurance. During initial and 60 day f/u call, health equity team member discussed insurance options with pt and referred her to [facility]. Chart review does not indicate pt has ongoing PCP but has been to gyn provider in 2023, Dr. Hester Mates at [facility] [place]. 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 Pt also notes the [employer] choices she already knew about from looking on line herself (and same sources noted from [facility] resource) are either too expensive a month or have too high a co-pay and/or deductible, that she has not had insurance "for years" because the expense is too high and she prefers to pay out of pocket as needed a primary care office when she needs care, or urgent care. Pt thanked the health equity team members for their support and options. Pt does not have any current SDOH. No additional health equity team support scheduled at this time.

## 2024-03-13 ENCOUNTER — Ambulatory Visit: Payer: Self-pay

## 2024-03-13 DIAGNOSIS — Z23 Encounter for immunization: Secondary | ICD-10-CM
